# Patient Record
Sex: Male | Born: 1940 | Race: White | Hispanic: No | Marital: Single | Smoking: Never smoker
Health system: Southern US, Community
[De-identification: ages and names within clinical notes are randomized; demographics above are authoritative.]

## PROBLEM LIST (undated history)

## (undated) DIAGNOSIS — G039 Meningitis, unspecified: Secondary | ICD-10-CM

## (undated) DIAGNOSIS — E119 Type 2 diabetes mellitus without complications: Secondary | ICD-10-CM

## (undated) DIAGNOSIS — H3530 Unspecified macular degeneration: Secondary | ICD-10-CM

## (undated) DIAGNOSIS — E611 Iron deficiency: Secondary | ICD-10-CM

## (undated) DIAGNOSIS — I10 Essential (primary) hypertension: Secondary | ICD-10-CM

## (undated) DIAGNOSIS — D649 Anemia, unspecified: Secondary | ICD-10-CM

## (undated) DIAGNOSIS — E785 Hyperlipidemia, unspecified: Secondary | ICD-10-CM

## (undated) HISTORY — PX: APPENDECTOMY: SHX54

## (undated) HISTORY — PX: TONSILLECTOMY: SUR1361

## (undated) HISTORY — PX: COLONOSCOPY: SHX174

## (undated) HISTORY — PX: HERNIA REPAIR: SHX51

## (undated) HISTORY — PX: ESOPHAGOGASTRODUODENOSCOPY: SHX1529

---

## 2006-03-17 ENCOUNTER — Encounter: Admission: RE | Admit: 2006-03-17 | Discharge: 2006-03-17 | Payer: Self-pay | Admitting: Internal Medicine

## 2006-03-17 IMAGING — CT CT CHEST W/ CM
4 series · 18 of 30 positions shown, 19 images · IV contrast (75CC OMNI 300)
Comparison: none

CLINICAL DATA: Enlarged heart.  Question descending thoracic aortic aneurysm on chest x-ray.
 CT CHEST WITH CONTRAST:
TECHNIQUE: Multidetector CT imaging of the chest was performed following the standard protocol during bolus administration of intravenous contrast.
 Contrast:  75 cc Omnipaque 300

[Series 2: routine chest · axial · 0.79mm/px · z∈[-216,-52]mm · 4 of 67 slices shown, 5 images]
[im 17/67  mediastinal]
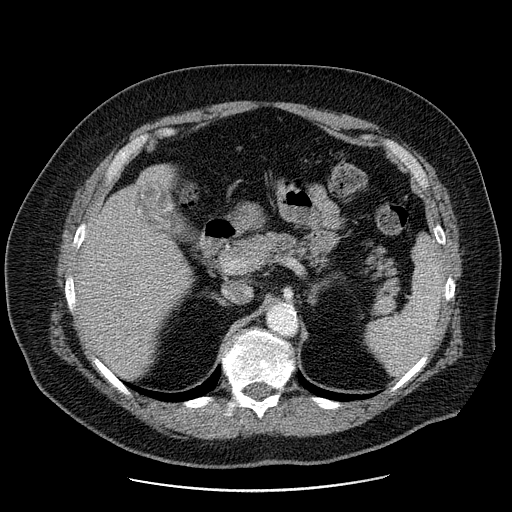
[im 17/67  lung]
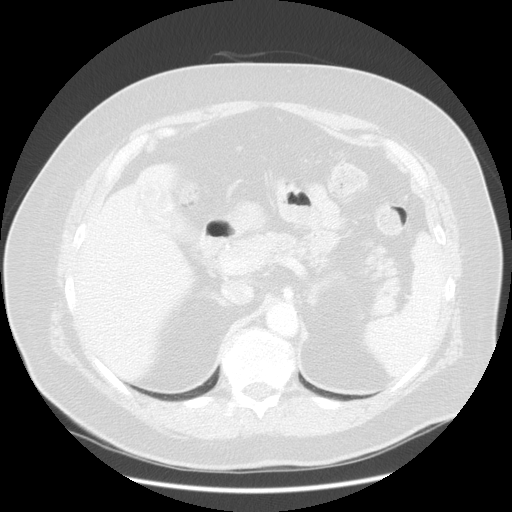
[im 34/67  lung]
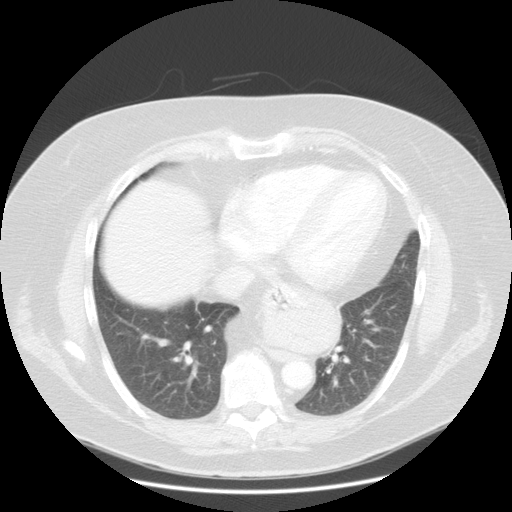
[im 36/67  lung]
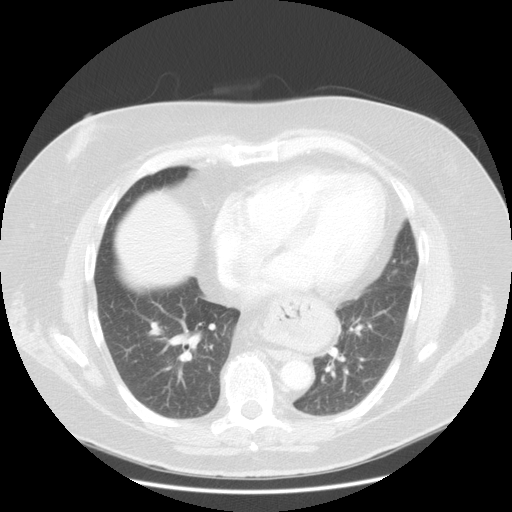
[im 50/67  lung]
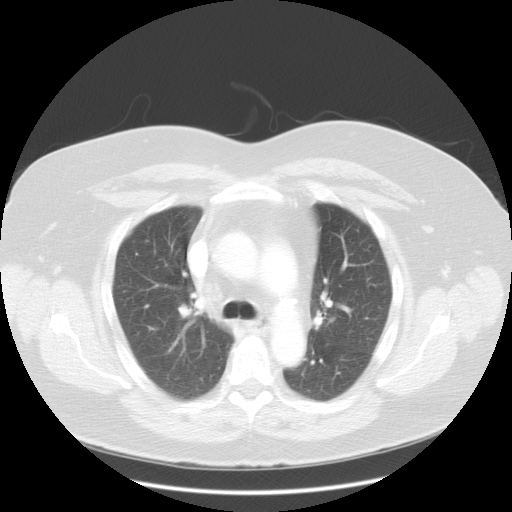

[Series 3: recon 2: routine chest · axial · 0.79mm/px · z∈[-186,-42]mm · 4 of 59 slices shown]
[im 15/59  lung]
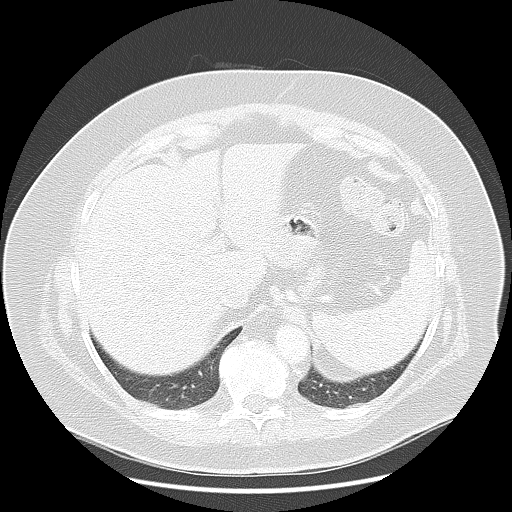
[im 28/59  lung]
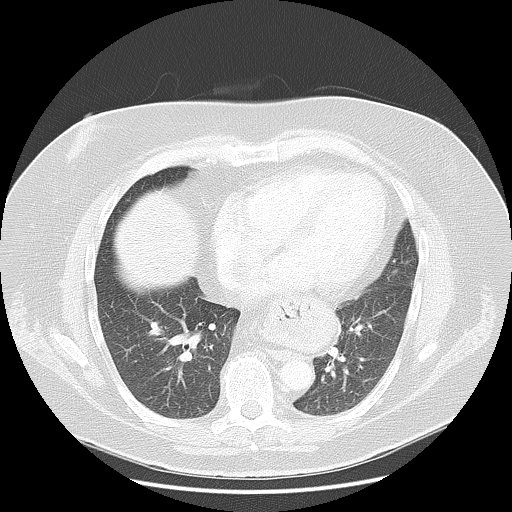
[im 30/59  lung]
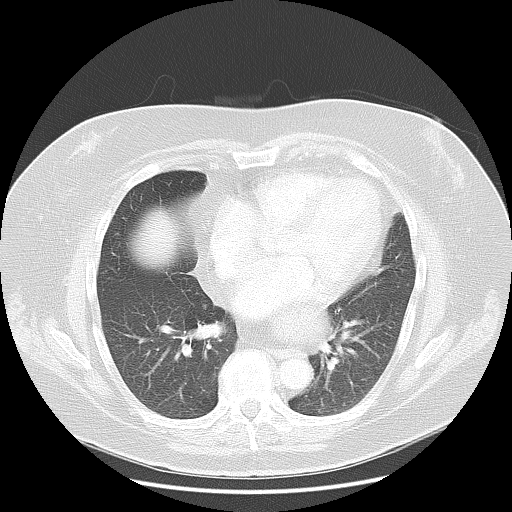
[im 44/59  lung]
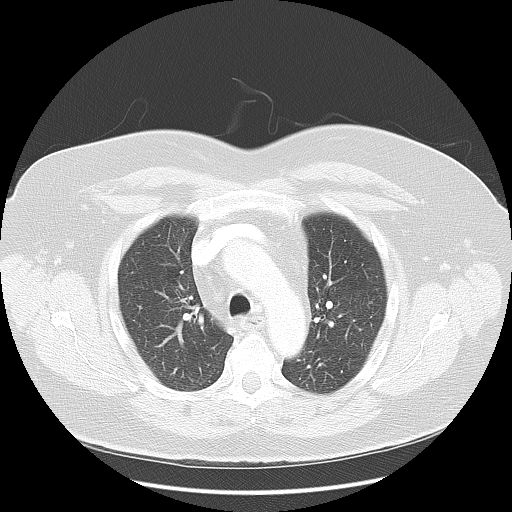

[Series 103: reformatted · sagittal · 0.79mm/px · 8 of 131 slices shown (1 of 2)]
[im 15/131  lung]
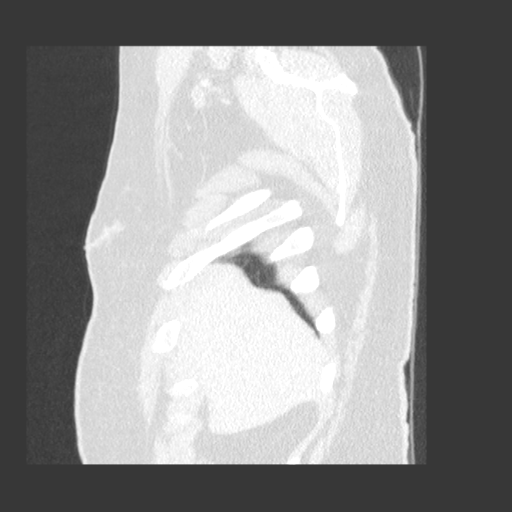
[im 29/131  lung]
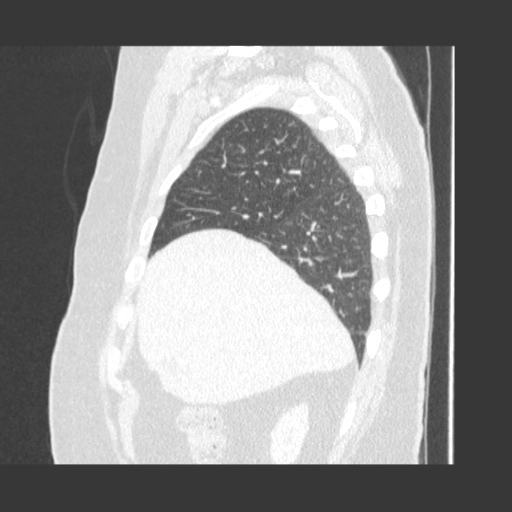
[im 44/131  lung]
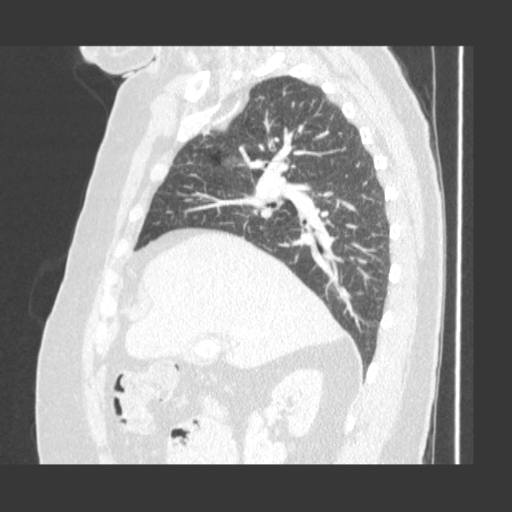
[im 58/131  lung]
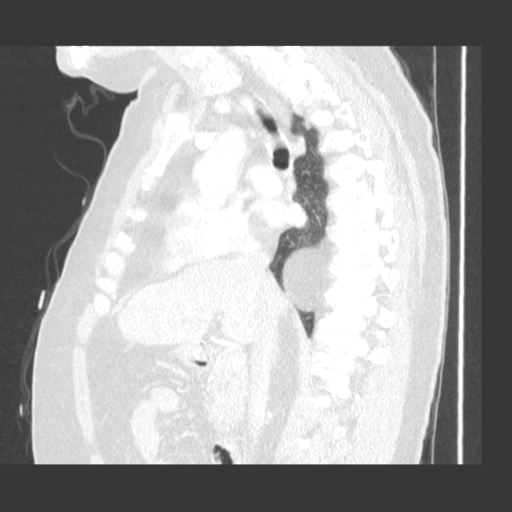
[im 73/131  lung]
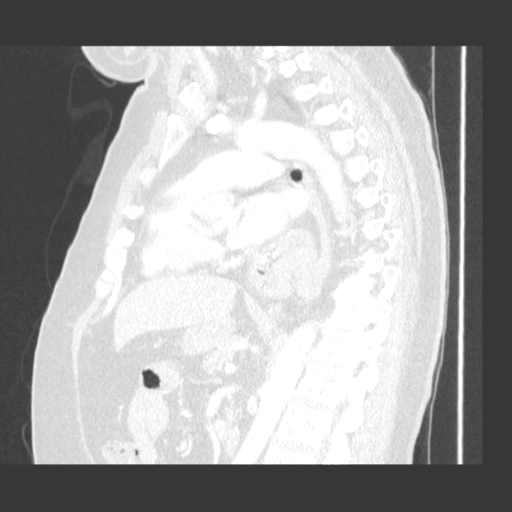
[im 87/131  lung]
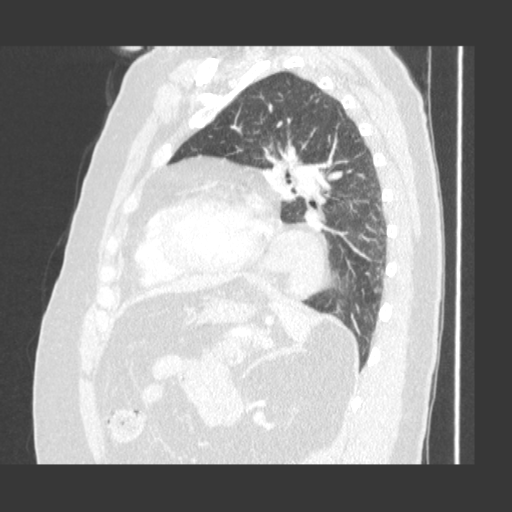
[im 102/131  lung]
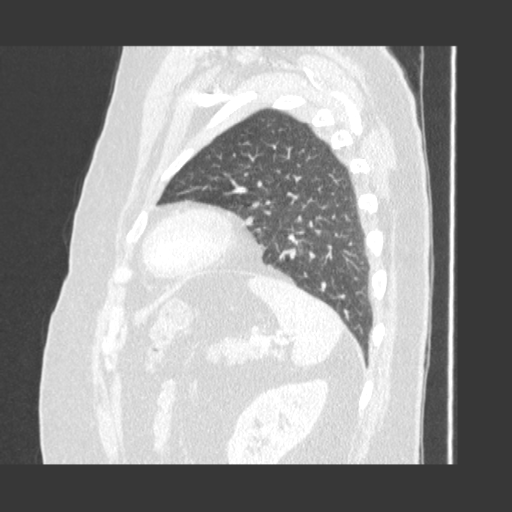
[im 116/131  lung]
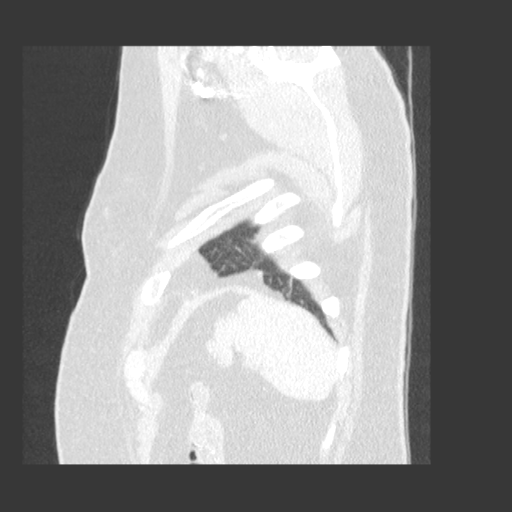

[Series 104: reformatted · coronal · 0.79mm/px · 2 of 106 slices shown (2 of 2)]
[im 16/106  lung]
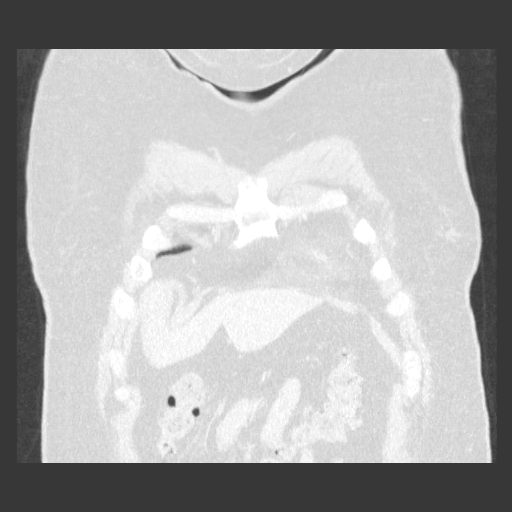
[im 31/106  lung]
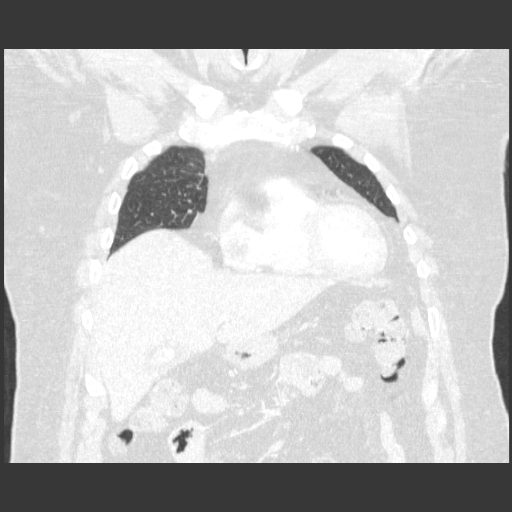

[18 of 30 positions shown; findings below may reference images not displayed]

FINDINGS: The thoracic aorta opacifies well and there is no evidence of thoracic aortic aneurysm.  The mass created on chest x-ray at the medial left lung base represents a moderate to large hiatal hernia.  The pulmonary arteries opacify normally.  No mediastinal or hilar adenopathy is seen with mild mediastinal lipomatosis present.  No bony abnormality is seen.   Within the upper abdomen, there are multiple gallstones partially calcified within the gallbladder.
IMPRESSION: 1.  The mass noted medially at the left lung base on chest x-ray represents a moderate to large hiatal hernia.  No thoracic aortic aneurysm is seen. 
 2.  Multiple gallstones.

## 2006-07-16 ENCOUNTER — Inpatient Hospital Stay (HOSPITAL_COMMUNITY): Admission: AD | Admit: 2006-07-16 | Discharge: 2006-07-17 | Payer: Self-pay | Admitting: Internal Medicine

## 2006-07-19 ENCOUNTER — Ambulatory Visit: Payer: Self-pay | Admitting: Gastroenterology

## 2006-08-26 ENCOUNTER — Ambulatory Visit (HOSPITAL_COMMUNITY): Admission: RE | Admit: 2006-08-26 | Discharge: 2006-08-26 | Payer: Self-pay | Admitting: *Deleted

## 2008-12-02 ENCOUNTER — Encounter (HOSPITAL_COMMUNITY): Admission: RE | Admit: 2008-12-02 | Discharge: 2009-03-02 | Payer: Self-pay | Admitting: Internal Medicine

## 2010-06-09 ENCOUNTER — Ambulatory Visit (HOSPITAL_COMMUNITY): Admission: RE | Admit: 2010-06-09 | Discharge: 2010-06-09 | Payer: Self-pay | Admitting: Internal Medicine

## 2010-09-09 ENCOUNTER — Ambulatory Visit (HOSPITAL_COMMUNITY): Admission: RE | Admit: 2010-09-09 | Discharge: 2010-09-09 | Payer: Self-pay | Admitting: Gastroenterology

## 2011-02-06 LAB — CROSSMATCH: ABO/RH(D): B POS

## 2011-03-08 LAB — CROSSMATCH: ABO/RH(D): B POS

## 2011-03-08 LAB — CBC
HCT: 31.8 % — ABNORMAL LOW (ref 39.0–52.0)
Hemoglobin: 9.7 g/dL — ABNORMAL LOW (ref 13.0–17.0)
MCHC: 30.5 g/dL (ref 30.0–36.0)
MCV: 69.5 fL — ABNORMAL LOW (ref 78.0–100.0)
RBC: 4.57 MIL/uL (ref 4.22–5.81)
RDW: 25.9 % — ABNORMAL HIGH (ref 11.5–15.5)

## 2011-04-09 NOTE — H&P (Signed)
NAMECAIDEN, ARTEAGA NO.:  [id number]   MEDICAL RECORD NO.:  [id number]          PATIENT TYPE:  OBV   LOCATION:  4707                         FACILITY:  [facility]   PHYSICIAN:  Lonia Blood, M.D.DATE OF BIRTH:  [birth year]   DATE OF ADMISSION:  07/15/2006  DATE OF DISCHARGE:                                HISTORY & PHYSICAL   PRIMARY CARE PHYSICIAN:  Mcdonald Reiling. Lendell Caprice, M.D.   CHIEF COMPLAINT:  Severe anemia with syncope.   HISTORY OF THE PRESENT ILLNESS:  Mr. Dustin Bumbaugh is a 70 year old  gentleman with a medical history as detailed below.  The history is somewhat  difficult as the patient is somewhat overcumudgeon and has to be redirected  o multiple occasions during the history.  It does appear, however, that the  patient suffered a syncopal spell approximately a week ago.  He describes  himself as having been out driving around in his cool car with the air  conditioner running, stepping out at home and into the heat, feeling a  sudden head rush, and then slumping to the ground.  He states that her did  not actually pass out, but he just felt extremely weak all of a sudden.  He did not lose consciousness by his report.  Her was able to get himself  back up to his feet though he felt very lightheaded.  He then stumbled to  the stairs where he sat for approximately 10 minutes before regaining his  strength enough to go int his condo.  He has not had similar symptoms since  then.  He denies dizziness on standing now, but does report that he does  feel somewhat weak in general.  He has had a regular appetite.  He has not  noticed any melena or hematochezia.  He has not had any change in his  weight.  He has not had chest pain or shortness of breath.  There has been  no hemoptysis or hematemesis.   After his episode he contacted his primary care physician's office and he  was able to be seen on July 11, 2006.  At that time blood tests were  obtained,  which ultimately revealed that the patient had a hemoglobin of  5.2.  The patient was contacted by his primary care physician and returned  to the office for recheck.  Recheck of the hemoglobin revealed a hemoglobin  of 5.7.  This was supported by findings in Dr. Pincus Sanes office of pallor.  As a result the patient was contacted by Dr. Lendell Caprice and advised that he  should present immediately to the hospital for direct admission.   The patient is seen up on 4700 after he had already been placed into a bed.   REVIEW OF SYSTEMS:  As noted above the patient states that he has been in  his usual state of health with the exception to the positive illness noted  above.  He has no other specific complaints at this time other than the fact  that he does not wish to be in the hospital.   PAST MEDICAL HISTORY:  1. Large hiatal hernia diagnosed via CT scan in April 2007.  2. Multiple gallstones diagnosed via CT scan in April 2007.  3. Hyperlipidemia.  4. Benign prostatic hypertrophy.  5. Inguinal hernia repair on the left in 1983.  6. Appendectomy in 1984.   MEDICATIONS:  Outpatient medications include:  1. Enteric coated aspirin 81 mg daily.  2. Lipitor 10 mg daily.   ALLERGIES:  No known drug allergies.   FAMILY HISTORY:  The family history is noncontributory to this admission.   SOCIAL HISTORY:  The patient lives in [place].  He lives alone.  He has  never been married.  He has no children.  He does not smoke.  He occasional  partakes of alcohol, specifically scotch.   DATA REVIEWED:  Hemoglobin today is 5.7 with a white count of 11.1 and a  platelet count of 505,000 and MCV 62.  Sodium, potassium, chloride, bicarb,  BUN, creatinine, and glucose are all normal.  Calcium is normal.  Hemoglobin  was 5.2 on July 11, 2006 in Dr. Pincus Sanes office.  Stool guaiac cards  were obtained through Dr. Pincus Sanes office and reportedly were all three  negative as an outpatient.  INR 1.1, PTT  27.   PHYSICAL EXAMINATION:  VITAL SIGNS:  Temperature 98.5, blood pressure  156/78, respiratory rate 20, O2 sat  99% on room air, and heart rate 102.  GENERAL APPEARANCE:  This is an obese male in no acute respiratory distress.  SKIN:  Cutaneous - the patient is markedly pale.  There is severe pallor of  the palms and there is loss of coloration in the nailbeds.  HEENT:  Normocephalic and atraumatic.  Pupils are equal, round and react to  light and accommodation.  Extraocular muscles are intact bilaterally.  Oral  cavity and oropharynx, clear.  NECK:  The neck is with no JVD and no lymphadenopathy.  LUNGS:  The lungs are clear to auscultation bilaterally without wheezes or  rhonchi.  HEART:  Cardiovascular - regular rate and rhythm without murmur, gallop or  rub.  Normal S1 and S2.  ABDOMEN:  The abdomen is obese, soft and bowel sounds are present.  No  hepatosplenomegaly and no rebound.  No ascites.  EXTREMITIES:  The extremities show no significant cyanosis, clubbing or  edema in the bilateral lower extremities.  NEUROLOGIC:  The patient is alert and oriented times four.  Cranial nerves  II-XII are intact bilaterally.  The patient has 5/5 strength bilateral upper  and lower extremities.  No Babinski's.  Intact sensation to touch  throughout.   IMPRESSION AND PLAN:  1. Severe microcytic anemia.  My initial concern with Mr. Liller is that of a possible gastrointestinal  source of blood loss.  It is confusing, however, that he would have three  stool cards, which were negative in the outpatient setting.   We will retest the patient in the hospital.  In that he has never had a  colonoscopy he would qualify based solely on the fact that he is presently  9.  Regardless of our findings during his anemia workup I will advise that  he proceed with plans to have his colonoscopy on Monday with Dr. Sabino Gasser as arranged by Dr. Lendell Caprice.  We must also consider the possibility of   hemolysis.  Laboratories were obtained prior to his transfusion and we will  await the results of those for further evaluation.  A third possibility is  that of a myeloproliferative disorder such as myelodysplasia.  Should the  patient's gastrointestinal evaluation be unremarkable and should hemolysis  workup be unrevealing we will need to consider hematology referral with bone  marrow biopsy.  Regardless, the patient is symptomatic from his severe  anemia and will require transfusion.  We are transfusing initially two units  of packed red blood cells with a target hemoglobin of 8.0 in the absence of  coronary disease.  We will check serial complete blood counts to assure the  patient's hemoglobin is stable and we will discontinue transfusions at such  time that he has a hemoglobin of greater than 8.   1. Syncope.   The patient is currently wearing a Holter monitor as set up in the  outpatient setting because of his syncope.  It appears clear to me that his  syncope is secondary to his anemia instead.  We will follow the patient on  telemetry during his hospital stay.  We will allow the Holter to remain in  place as well simply to complete the study and to assure that the device is  not lost during his hospital stay.   1. Hypertension.  The patient has mildly elevated blood pressure.  This is understandable in  this gentleman who is quite aggravated that he is in the hospital to begin  with.  I have explained to him the reasons for his hospitalization.   We will follow his blood pressure without treatment for now; and, if he  continues to trend high throughout his hospital stay we will consider adding  an agent.   1. Hyperlipidemia.   We will continue his Lipitor as previously dosed.      Lonia Blood, M.D.  Electronically Signed     JTM/MEDQ  D:  07/15/2006  T:  07/15/2006  Job:  [id number]   cc:   Janae Bridgeman. Eloise Harman., M.D.

## 2011-04-09 NOTE — Discharge Summary (Signed)
NAMETAIGE, HOUSMAN NO.:  [id number]   MEDICAL RECORD NO.:  [id number]          PATIENT TYPE:  INP   LOCATION:  4707                         FACILITY:  [facility]   PHYSICIAN:  Della Goo, M.D. DATE OF BIRTH:  02/06/1941   DATE OF ADMISSION:  07/15/2006  DATE OF DISCHARGE:  07/17/2006                                 DISCHARGE SUMMARY   CONSULTANTS:  [facility] on-call.   FINAL DIAGNOSES:  1. Severe microcytic anemia.  2. Hyperlipidemia.  3. Benign prostatic hypertrophy.  4. Obesity.   HOSPITAL COURSE:  This is a 70 year old gentleman who was sent to the  emergency department secondary to a severely decreased hemoglobin level,  found to be 5.7 on admission.  Patient was admitted for further evaluation  and for transfusion.  Patient denied having any history of hematemesis,  melena or hematochezia.  He stated that his primary care physician sent lab  work based on examination and findings of pallor.  Patient denied having any  symptoms of weakness or dizziness or syncope.   Patient was admitted and has transfusion of 2 units of packed red blood  cells with an appropriate rise in his hemoglobin leve from 5.7 to 7.4.  He  remained hemodynamically stable and while hospitalized had no hematochezia  or heme positive stools.  He was transfused 2 additional units of packed red  blood cells and had an appropriate rise to 9.6 on discharge.  Patient was  seen by [facility] and discussed options of undergoing a GI evaluation while  hospitalized, but patient declined and opted to have his workup performed as  an outpatient with Dr. Sabino Gasser.  An appointment was scheduled for patient  to followup with Dr. Virginia Rochester on Monday, July 18, 2006.  Patient was given  further instructions to continue to hold his aspirin therapy until being  seen by Dr. Virginia Rochester.  He will continue on his regular medications, which are  Lipitor 10 mg one p.o. q.day.   DISPOSITION:  The  patient was discharged to home and is to followup with Dr.  Virginia Rochester, as well as his primary care physician, Dr. Lendell Caprice.      Della Goo, M.D.  Electronically Signed     HJ/MEDQ  D:  07/17/2006  T:  07/17/2006  Job:  [id number]   cc:   Georgiana Spinner, M.D.  Corinna L. Lendell Caprice, MD

## 2014-12-19 ENCOUNTER — Encounter (HOSPITAL_COMMUNITY): Payer: Self-pay | Admitting: General Practice

## 2014-12-19 ENCOUNTER — Observation Stay (HOSPITAL_COMMUNITY)
Admission: AD | Admit: 2014-12-19 | Discharge: 2014-12-20 | Disposition: A | Discharge status: Home / Self Care | Payer: Medicare Other | Source: Ambulatory Visit | Attending: Internal Medicine | Admitting: Internal Medicine

## 2014-12-19 DIAGNOSIS — M5134 Other intervertebral disc degeneration, thoracic region: Secondary | ICD-10-CM | POA: Diagnosis not present

## 2014-12-19 DIAGNOSIS — I10 Essential (primary) hypertension: Secondary | ICD-10-CM | POA: Diagnosis not present

## 2014-12-19 DIAGNOSIS — D649 Anemia, unspecified: Secondary | ICD-10-CM | POA: Insufficient documentation

## 2014-12-19 DIAGNOSIS — R42 Dizziness and giddiness: Secondary | ICD-10-CM | POA: Insufficient documentation

## 2014-12-19 DIAGNOSIS — D509 Iron deficiency anemia, unspecified: Secondary | ICD-10-CM | POA: Diagnosis not present

## 2014-12-19 DIAGNOSIS — E119 Type 2 diabetes mellitus without complications: Secondary | ICD-10-CM | POA: Diagnosis not present

## 2014-12-19 DIAGNOSIS — E785 Hyperlipidemia, unspecified: Secondary | ICD-10-CM | POA: Diagnosis not present

## 2014-12-19 DIAGNOSIS — H3530 Unspecified macular degeneration: Secondary | ICD-10-CM | POA: Diagnosis not present

## 2014-12-19 DIAGNOSIS — E780 Pure hypercholesterolemia: Secondary | ICD-10-CM | POA: Diagnosis not present

## 2014-12-19 DIAGNOSIS — M5136 Other intervertebral disc degeneration, lumbar region: Secondary | ICD-10-CM | POA: Diagnosis not present

## 2014-12-19 DIAGNOSIS — M549 Dorsalgia, unspecified: Secondary | ICD-10-CM | POA: Diagnosis not present

## 2014-12-19 HISTORY — DX: Meningitis, unspecified: G03.9

## 2014-12-19 HISTORY — DX: Anemia, unspecified: D64.9

## 2014-12-19 HISTORY — DX: Unspecified macular degeneration: H35.30

## 2014-12-19 HISTORY — DX: Iron deficiency: E61.1

## 2014-12-19 HISTORY — DX: Essential (primary) hypertension: I10

## 2014-12-19 HISTORY — DX: Hyperlipidemia, unspecified: E78.5

## 2014-12-19 LAB — PREPARE RBC (CROSSMATCH)

## 2014-12-19 LAB — COMPREHENSIVE METABOLIC PANEL
ALK PHOS: 66 U/L (ref 39–117)
ALT: 23 U/L (ref 0–53)
AST: 20 U/L (ref 0–37)
Albumin: 3.6 g/dL (ref 3.5–5.2)
Anion gap: 8 (ref 5–15)
BUN: 23 mg/dL (ref 6–23)
CALCIUM: 8.7 mg/dL (ref 8.4–10.5)
CO2: 22 mmol/L (ref 19–32)
Chloride: 109 mmol/L (ref 96–112)
Creatinine, Ser: 1.06 mg/dL (ref 0.50–1.35)
GFR calc Af Amer: 78 mL/min — ABNORMAL LOW (ref >90)
GFR calc non Af Amer: 68 mL/min — ABNORMAL LOW (ref >90)
GLUCOSE: 117 mg/dL — AB (ref 70–99)
POTASSIUM: 4.5 mmol/L (ref 3.5–5.1)
Sodium: 139 mmol/L (ref 135–145)
Total Bilirubin: 0.7 mg/dL (ref 0.3–1.2)
Total Protein: 6.6 g/dL (ref 6.0–8.3)

## 2014-12-19 LAB — TSH: TSH: 1.924 u[IU]/mL (ref 0.350–4.500)

## 2014-12-19 MED ORDER — ACETAMINOPHEN 325 MG PO TABS: 650.0000 mg | ORAL_TABLET | Freq: Four times a day (QID) | ORAL | Status: DC | PRN

## 2014-12-19 MED ORDER — DIPHENHYDRAMINE HCL 25 MG PO CAPS
25.0000 mg | ORAL_CAPSULE | Freq: Once | ORAL | Status: AC
  Administered 2014-12-19: 25 mg via ORAL
  Filled 2014-12-19: qty 1

## 2014-12-19 MED ORDER — ACETAMINOPHEN 325 MG PO TABS
650.0000 mg | ORAL_TABLET | Freq: Once | ORAL | Status: AC
  Administered 2014-12-19: 650 mg via ORAL
  Filled 2014-12-19: qty 2

## 2014-12-19 MED ORDER — ACETAMINOPHEN 650 MG RE SUPP: 650.0000 mg | Freq: Four times a day (QID) | RECTAL | Status: DC | PRN

## 2014-12-19 MED ORDER — FLUTICASONE PROPIONATE 50 MCG/ACT NA SUSP: 1.0000 | Freq: Two times a day (BID) | NASAL | Status: DC | PRN

## 2014-12-19 MED ORDER — SODIUM CHLORIDE 0.9 % IV SOLN: Freq: Once | INTRAVENOUS | Status: DC

## 2014-12-19 NOTE — H&P (Signed)
James Cooke is an 74 y.o. male.    Pcp:  [name]  Chief Complaint: anemia HPI: 74 yo male with htn, hyperlipidemia, iron def anemia c/o dizziness since 10/2014, pt found on labs to have hgb 5.2.  Pt sent to the hospital for transfusion.  Denies abd pain, n/v, diarrhea, brbpr.    Past Medical History  Diagnosis Date  . Hypertension   . Hyperlipidemia   . Anemia   . Meningitis   . Iron deficiency   . Macular degeneration     Past Surgical History  Procedure Laterality Date  . Tonsillectomy    . Appendectomy    . Hernia repair    . Esophagogastroduodenoscopy    . Colonoscopy      Family History  Problem Relation Age of Onset  . CAD Mother    Social History:  reports that he has never smoked. He does not have any smokeless tobacco history on file. He reports that he does not drink alcohol. His drug history is not on file.  Allergies: No Known Allergies  Medications Prior to Admission  Medication Sig Dispense Refill  . amLODipine (NORVASC) 5 MG tablet Take 5 mg by mouth daily.    . Ascorbic Acid (VITAMIN C) 1000 MG tablet Take 1,000 mg by mouth daily.    Marland Kitchen atorvastatin (LIPITOR) 80 MG tablet Take 80 mg by mouth daily.    . Cyanocobalamin (VITAMIN B 12 PO) Take 1 tablet by mouth daily.    . ferrous sulfate 325 (65 FE) MG tablet Take 325 mg by mouth at bedtime.    . Fish Oil-Cholecalciferol (OMEGA-3 + VITAMIN D3 PO) Take 1 tablet by mouth daily.    . fluticasone (FLONASE) 50 MCG/ACT nasal spray Place 1-2 sprays into both nostrils 2 (two) times daily as needed for allergies or rhinitis.    . metFORMIN (GLUCOPHAGE) 500 MG tablet Take 500 mg by mouth 2 (two) times daily with a meal.    . Multiple Vitamins-Minerals (ICAPS) CAPS Take 1 capsule by mouth daily.    . naproxen sodium (ANAPROX) 220 MG tablet Take 440 mg by mouth daily as needed (pain).      Results for orders placed or performed during the hospital encounter of 12/19/14 (from the past 48 hour(s))  Prepare RBC      Status: None   Collection Time: 12/19/14  6:00 PM  Result Value Ref Range   Order Confirmation ORDER PROCESSED BY BLOOD BANK   Type and screen     Status: None (Preliminary result)   Collection Time: 12/19/14  6:30 PM  Result Value Ref Range   ABO/RH(D) B POS    Antibody Screen NEG    Sample Expiration 12/22/2014    Unit Number [id number]    Blood Component Type RED CELLS,LR    Unit division 00    Status of Unit ISSUED    Transfusion Status OK TO TRANSFUSE    Crossmatch Result Compatible    Unit Number [id number]    Blood Component Type RED CELLS,LR    Unit division 00    Status of Unit ISSUED,FINAL    Transfusion Status OK TO TRANSFUSE    Crossmatch Result Compatible    Unit Number [id number]    Blood Component Type RED CELLS,LR    Unit division 00    Status of Unit ISSUED    Transfusion Status OK TO TRANSFUSE    Crossmatch Result Compatible   Vitamin B12     Status: None  Collection Time: 12/19/14  6:30 PM  Result Value Ref Range   Vitamin B-12 869 211 - 911 pg/mL    Comment: Performed at [facility]  Iron and TIBC     Status: Abnormal   Collection Time: 12/19/14  6:30 PM  Result Value Ref Range   Iron <10 (L) 42 - 165 ug/dL    Comment: Result repeated and verified.   TIBC Not calculated due to Iron <10. 215 - 435 ug/dL   Saturation Ratios Not calculated due to Iron <10. 20 - 55 %   UIBC 452 (H) 125 - 400 ug/dL    Comment: Performed at [facility]  Ferritin     Status: Abnormal   Collection Time: 12/19/14  6:30 PM  Result Value Ref Range   Ferritin 7 (L) 22 - 322 ng/mL    Comment: Performed at [facility]  TSH     Status: None   Collection Time: 12/19/14  6:30 PM  Result Value Ref Range   TSH 1.924 0.350 - 4.500 uIU/mL    Comment: Performed at [facility]  Comprehensive metabolic panel     Status: Abnormal   Collection Time: 12/19/14  6:30 PM  Result Value Ref Range   Sodium 139 135 - 145 mmol/L   Potassium 4.5 3.5  - 5.1 mmol/L   Chloride 109 96 - 112 mmol/L   CO2 22 19 - 32 mmol/L   Glucose, Bld 117 (H) 70 - 99 mg/dL   BUN 23 6 - 23 mg/dL   Creatinine, Ser 1.06 0.50 - 1.35 mg/dL   Calcium 8.7 8.4 - 10.5 mg/dL   Total Protein 6.6 6.0 - 8.3 g/dL   Albumin 3.6 3.5 - 5.2 g/dL   AST 20 0 - 37 U/L   ALT 23 0 - 53 U/L   Alkaline Phosphatase 66 39 - 117 U/L   Total Bilirubin 0.7 0.3 - 1.2 mg/dL   GFR calc non Af Amer 68 (L) >90 mL/min   GFR calc Af Amer 78 (L) >90 mL/min    Comment: (NOTE) The eGFR has been calculated using the CKD EPI equation. This calculation has not been validated in all clinical situations. eGFR's persistently <90 mL/min signify possible Chronic Kidney Disease.    Anion gap 8 5 - 15  ABO/Rh     Status: None   Collection Time: 12/19/14  6:30 PM  Result Value Ref Range   ABO/RH(D) B POS   Hemoglobin and hematocrit, blood     Status: Abnormal   Collection Time: 12/20/14  9:30 AM  Result Value Ref Range   Hemoglobin 8.3 (L) 13.0 - 17.0 g/dL   HCT 28.2 (L) 39.0 - 52.0 %   No results found.  Review of Systems  Constitutional: Negative for fever, chills, weight loss, malaise/fatigue and diaphoresis.  HENT: Negative for congestion, ear discharge, ear pain, hearing loss, nosebleeds, sore throat and tinnitus.   Eyes: Negative for blurred vision, double vision, photophobia, pain, discharge and redness.  Respiratory: Negative for cough, hemoptysis, sputum production, shortness of breath, wheezing and stridor.   Cardiovascular: Negative for chest pain, palpitations, orthopnea, claudication, leg swelling and PND.  Skin: Negative for itching and rash.  Neurological: Negative for weakness and headaches.    Blood pressure 155/66, pulse 71, temperature 97.6 F (36.4 C), temperature source Oral, resp. rate 20, SpO2 98 %. Physical Exam  Constitutional: He is oriented to person, place, and time. He appears well-developed and well-nourished.  HENT:  Head: Normocephalic and atraumatic.   Eyes: EOM are normal. Pupils are equal, round, and reactive to light.  Pale conjuctiva  Neck: Normal range of motion. Neck supple. No JVD present. No tracheal deviation present. No thyromegaly present.  Cardiovascular: Normal rate and regular rhythm.  Exam reveals no gallop and no friction rub.   No murmur heard. Respiratory: Effort normal and breath sounds normal. No respiratory distress. He has no wheezes. He has no rales.  GI: Soft. Bowel sounds are normal. He exhibits no distension. There is no tenderness. There is no rebound and no guarding.  Musculoskeletal: Normal range of motion. He exhibits no edema or tenderness.  Lymphadenopathy:    He has no cervical adenopathy.  Neurological: He is alert and oriented to person, place, and time. He has normal reflexes. He displays normal reflexes. No cranial nerve deficit. He exhibits normal muscle tone. Coordination normal.  Skin: Skin is warm and dry. No rash noted. No erythema. No pallor.  Psychiatric: He has a normal mood and affect. His behavior is normal. Judgment and thought content normal.     Assessment/Plan Dizziness likely secondary to anemia  Anemia Type and screen Check iron , tibc, ferritin, b12, folate, tsh Transfuse 3 units prbc.  H/h 1 hour after transfusion  Hypertension Cont current tx.   [name] 12/20/2014, 10:07 AM

## 2014-12-20 ENCOUNTER — Encounter (HOSPITAL_COMMUNITY): Payer: Self-pay | Admitting: Internal Medicine

## 2014-12-20 DIAGNOSIS — H3530 Unspecified macular degeneration: Secondary | ICD-10-CM | POA: Diagnosis not present

## 2014-12-20 DIAGNOSIS — R42 Dizziness and giddiness: Secondary | ICD-10-CM | POA: Diagnosis not present

## 2014-12-20 DIAGNOSIS — E119 Type 2 diabetes mellitus without complications: Secondary | ICD-10-CM | POA: Diagnosis not present

## 2014-12-20 DIAGNOSIS — E785 Hyperlipidemia, unspecified: Secondary | ICD-10-CM | POA: Diagnosis not present

## 2014-12-20 DIAGNOSIS — D649 Anemia, unspecified: Secondary | ICD-10-CM | POA: Diagnosis not present

## 2014-12-20 DIAGNOSIS — I10 Essential (primary) hypertension: Secondary | ICD-10-CM | POA: Diagnosis not present

## 2014-12-20 DIAGNOSIS — D509 Iron deficiency anemia, unspecified: Secondary | ICD-10-CM | POA: Diagnosis not present

## 2014-12-20 LAB — VITAMIN B12: VITAMIN B 12: 869 pg/mL (ref 211–911)

## 2014-12-20 LAB — ABO/RH: ABO/RH(D): B POS

## 2014-12-20 LAB — IRON AND TIBC: UIBC: 452 ug/dL — ABNORMAL HIGH (ref 125–400)

## 2014-12-20 LAB — FERRITIN: Ferritin: 7 ng/mL — ABNORMAL LOW (ref 22–322)

## 2014-12-20 LAB — HEMOGLOBIN AND HEMATOCRIT, BLOOD
HCT: 28.2 % — ABNORMAL LOW (ref 39.0–52.0)
HEMOGLOBIN: 8.3 g/dL — AB (ref 13.0–17.0)

## 2014-12-20 MED ORDER — METFORMIN HCL 500 MG PO TABS
500.0000 mg | ORAL_TABLET | Freq: Two times a day (BID) | ORAL | Status: DC
  Filled 2014-12-20 (×2): qty 1

## 2014-12-20 MED ORDER — AMLODIPINE BESYLATE 5 MG PO TABS
5.0000 mg | ORAL_TABLET | Freq: Every day | ORAL | Status: DC
  Filled 2014-12-20: qty 1

## 2014-12-20 MED ORDER — ATORVASTATIN CALCIUM 80 MG PO TABS
80.0000 mg | ORAL_TABLET | Freq: Every day | ORAL | Status: DC
  Filled 2014-12-20: qty 1

## 2014-12-20 NOTE — Discharge Instructions (Signed)
Please contact pcp if feels weak or lightheaded, or sob.

## 2014-12-20 NOTE — Progress Notes (Signed)
Benedetto Coons to be D/C'd Home per MD order.  Discussed prescriptions and follow up appointments with the patient. Prescriptions given to patient, medication list explained in detail. Pt verbalized understanding.    Medication List    STOP taking these medications        naproxen sodium 220 MG tablet  Commonly known as:  ANAPROX      TAKE these medications        amLODipine 5 MG tablet  Commonly known as:  NORVASC  Take 5 mg by mouth daily.     atorvastatin 80 MG tablet  Commonly known as:  LIPITOR  Take 80 mg by mouth daily.     ferrous sulfate 325 (65 FE) MG tablet  Take 325 mg by mouth at bedtime.     fluticasone 50 MCG/ACT nasal spray  Commonly known as:  FLONASE  Place 1-2 sprays into both nostrils 2 (two) times daily as needed for allergies or rhinitis.     ICAPS Caps  Take 1 capsule by mouth daily.     metFORMIN 500 MG tablet  Commonly known as:  GLUCOPHAGE  Take 500 mg by mouth 2 (two) times daily with a meal.     OMEGA-3 + VITAMIN D3 PO  Take 1 tablet by mouth daily.     VITAMIN B 12 PO  Take 1 tablet by mouth daily.     vitamin C 1000 MG tablet  Take 1,000 mg by mouth daily.        Filed Vitals:   12/20/14 0700  BP: 155/66  Pulse: 71  Temp: 97.6 F (36.4 C)  Resp: 20    Skin clean, dry and intact without evidence of skin break down, no evidence of skin tears noted. IV catheter discontinued intact. Site without signs and symptoms of complications. Dressing and pressure applied. Pt denies pain at this time. No complaints noted.  An After Visit Summary was printed and given to the patient. Patient escorted via [place], and D/C home via private auto.  Nonie Hoyer S 12/20/2014 1:19 PM

## 2014-12-20 NOTE — Progress Notes (Signed)
UR completed 

## 2014-12-20 NOTE — Discharge Summary (Signed)
Physician Discharge Summary  Patient ID: James Cooke MRN: [id number] DOB/AGE: [birth year] 74 y.o.  Admit date: 12/19/2014 Discharge date: 12/20/2014  Admission Diagnoses: Anemia Dizziness  Discharge Diagnoses:  Active Problems:   Anemia   Discharged Condition: stable  Hospital Course:  74 yo male with hx of iron def anemia c/o lightheadedness since 10/2014.  Pt denies brbpr, hematuria.  Pt was admitted for symptomatic anemia.  Pt's hgb reponded well to transfusion of 3 units prbc.  Pt is doing better.  No dizziness.  Pt appears stable and will be discharged to home  Consults: None  Significant Diagnostic Studies: labs,  Hgb 5.2 in office  =>8.3  Treatments: transfusion of 3 units prbc  Discharge Exam: Blood pressure 155/66, pulse 71, temperature 97.6 F (36.4 C), temperature source Oral, resp. rate 20, SpO2 98 %. Heent: anicteric, pink conjunctiva Neck: no jvd Heart: rrr s1, s2,  Lung: ctab Abd: soft, nt, nd, +bs Ext: no c/c/e  Disposition:   Stable  A/P:   Symptomatic anemia Improved after transfusion of 3 units prbc.  Pt will continue to take his iron, and will need to follow up in 7-10 days for repeat cbc  Hypertension Cont amlodipine  Dm2:   Cont metformin      Medication List    STOP taking these medications        naproxen sodium 220 MG tablet  Commonly known as:  ANAPROX      TAKE these medications        amLODipine 5 MG tablet  Commonly known as:  NORVASC  Take 5 mg by mouth daily.     atorvastatin 80 MG tablet  Commonly known as:  LIPITOR  Take 80 mg by mouth daily.     ferrous sulfate 325 (65 FE) MG tablet  Take 325 mg by mouth at bedtime.     fluticasone 50 MCG/ACT nasal spray  Commonly known as:  FLONASE  Place 1-2 sprays into both nostrils 2 (two) times daily as needed for allergies or rhinitis.     ICAPS Caps  Take 1 capsule by mouth daily.     metFORMIN 500 MG tablet  Commonly known as:  GLUCOPHAGE  Take 500 mg by mouth  2 (two) times daily with a meal.     OMEGA-3 + VITAMIN D3 PO  Take 1 tablet by mouth daily.     VITAMIN B 12 PO  Take 1 tablet by mouth daily.     vitamin C 1000 MG tablet  Take 1,000 mg by mouth daily.           Follow-up Information    Follow up with [name], MD.   Specialty:  Internal Medicine   Contact information:   [address] [place] [address] [phone]       Signed: [name] 12/20/2014, 12:53 PM

## 2014-12-21 LAB — TYPE AND SCREEN
ABO/RH(D): B POS
Antibody Screen: NEGATIVE
UNIT DIVISION: 0
UNIT DIVISION: 0
Unit division: 0

## 2014-12-23 ENCOUNTER — Other Ambulatory Visit: Payer: Self-pay | Admitting: Internal Medicine

## 2014-12-23 DIAGNOSIS — R42 Dizziness and giddiness: Secondary | ICD-10-CM

## 2014-12-23 LAB — FOLATE RBC
Folate, Hemolysate: >620 ng/mL
Folate, RBC: >3316 ng/mL (ref >498)
HEMATOCRIT: 18.7 % — AB (ref 37.5–51.0)

## 2015-01-01 DIAGNOSIS — E780 Pure hypercholesterolemia: Secondary | ICD-10-CM | POA: Diagnosis not present

## 2015-01-01 DIAGNOSIS — E118 Type 2 diabetes mellitus with unspecified complications: Secondary | ICD-10-CM | POA: Diagnosis not present

## 2015-01-01 DIAGNOSIS — I10 Essential (primary) hypertension: Secondary | ICD-10-CM | POA: Diagnosis not present

## 2015-01-01 DIAGNOSIS — D649 Anemia, unspecified: Secondary | ICD-10-CM | POA: Diagnosis not present

## 2015-02-17 ENCOUNTER — Ambulatory Visit (HOSPITAL_COMMUNITY)
Admission: RE | Admit: 2015-02-17 | Discharge: 2015-02-17 | Disposition: A | Discharge status: Home / Self Care | Payer: Medicare Other | Source: Ambulatory Visit | Attending: Internal Medicine | Admitting: Internal Medicine

## 2015-02-17 ENCOUNTER — Other Ambulatory Visit: Payer: Self-pay | Admitting: Internal Medicine

## 2015-02-17 DIAGNOSIS — E118 Type 2 diabetes mellitus with unspecified complications: Secondary | ICD-10-CM | POA: Diagnosis not present

## 2015-02-17 DIAGNOSIS — I10 Essential (primary) hypertension: Secondary | ICD-10-CM | POA: Diagnosis not present

## 2015-02-17 DIAGNOSIS — D649 Anemia, unspecified: Secondary | ICD-10-CM | POA: Diagnosis not present

## 2015-02-17 DIAGNOSIS — E119 Type 2 diabetes mellitus without complications: Secondary | ICD-10-CM | POA: Diagnosis not present

## 2015-02-17 LAB — PREPARE RBC (CROSSMATCH)

## 2015-02-18 ENCOUNTER — Encounter (HOSPITAL_COMMUNITY)
Admission: RE | Admit: 2015-02-18 | Discharge: 2015-02-18 | Disposition: A | Discharge status: Home / Self Care | Payer: Medicare Other | Source: Ambulatory Visit | Attending: Internal Medicine | Admitting: Internal Medicine

## 2015-02-18 DIAGNOSIS — D649 Anemia, unspecified: Secondary | ICD-10-CM | POA: Diagnosis not present

## 2015-02-18 MED ORDER — ACETAMINOPHEN 325 MG PO TABS
650.0000 mg | ORAL_TABLET | Freq: Once | ORAL | Status: AC
  Administered 2015-02-18: 650 mg via ORAL

## 2015-02-18 MED ORDER — ACETAMINOPHEN 325 MG PO TABS
ORAL_TABLET | ORAL | Status: AC
  Filled 2015-02-18: qty 2

## 2015-02-18 MED ORDER — DIPHENHYDRAMINE HCL 25 MG PO CAPS
25.0000 mg | ORAL_CAPSULE | Freq: Once | ORAL | Status: AC
  Administered 2015-02-18: 25 mg via ORAL

## 2015-02-18 MED ORDER — SODIUM CHLORIDE 0.9 % IV SOLN: Freq: Once | INTRAVENOUS | Status: DC

## 2015-02-18 MED ORDER — DIPHENHYDRAMINE HCL 25 MG PO CAPS
ORAL_CAPSULE | ORAL | Status: AC
  Filled 2015-02-18: qty 1

## 2015-02-19 LAB — TYPE AND SCREEN
ABO/RH(D): B POS
ANTIBODY SCREEN: NEGATIVE
UNIT DIVISION: 0
Unit division: 0

## 2015-02-20 DIAGNOSIS — D649 Anemia, unspecified: Secondary | ICD-10-CM | POA: Diagnosis not present

## 2015-02-20 DIAGNOSIS — I10 Essential (primary) hypertension: Secondary | ICD-10-CM | POA: Diagnosis not present

## 2015-02-20 DIAGNOSIS — E780 Pure hypercholesterolemia: Secondary | ICD-10-CM | POA: Diagnosis not present

## 2015-02-20 DIAGNOSIS — E119 Type 2 diabetes mellitus without complications: Secondary | ICD-10-CM | POA: Diagnosis not present

## 2015-03-20 DIAGNOSIS — D509 Iron deficiency anemia, unspecified: Secondary | ICD-10-CM | POA: Diagnosis not present

## 2015-03-31 DIAGNOSIS — D649 Anemia, unspecified: Secondary | ICD-10-CM | POA: Diagnosis not present

## 2015-04-04 ENCOUNTER — Other Ambulatory Visit (HOSPITAL_COMMUNITY): Payer: Self-pay | Admitting: *Deleted

## 2015-04-04 ENCOUNTER — Other Ambulatory Visit: Payer: Self-pay | Admitting: Internal Medicine

## 2015-04-04 DIAGNOSIS — D649 Anemia, unspecified: Secondary | ICD-10-CM | POA: Diagnosis not present

## 2015-04-04 MED ORDER — ACETAMINOPHEN 325 MG PO TABS: 650.0000 mg | ORAL_TABLET | Freq: Once | ORAL | Status: AC

## 2015-04-04 MED ORDER — DIPHENHYDRAMINE HCL 25 MG PO CAPS: 25.0000 mg | ORAL_CAPSULE | Freq: Once | ORAL | Status: AC

## 2015-04-04 MED ORDER — SODIUM CHLORIDE 0.9 % IV SOLN: Freq: Once | INTRAVENOUS | Status: AC

## 2015-04-09 ENCOUNTER — Encounter (HOSPITAL_COMMUNITY)
Admission: RE | Admit: 2015-04-09 | Discharge: 2015-04-09 | Disposition: A | Discharge status: Home / Self Care | Payer: Medicare Other | Source: Ambulatory Visit | Attending: Internal Medicine | Admitting: Internal Medicine

## 2015-04-09 ENCOUNTER — Inpatient Hospital Stay (HOSPITAL_COMMUNITY): Admission: RE | Admit: 2015-04-09 | Payer: Medicare Other | Source: Ambulatory Visit

## 2015-04-09 ENCOUNTER — Inpatient Hospital Stay (HOSPITAL_COMMUNITY): Admission: RE | Admit: 2015-04-09 | Payer: Self-pay | Source: Ambulatory Visit

## 2015-04-09 DIAGNOSIS — D649 Anemia, unspecified: Secondary | ICD-10-CM | POA: Insufficient documentation

## 2015-04-09 LAB — PREPARE RBC (CROSSMATCH)

## 2015-04-10 ENCOUNTER — Encounter (HOSPITAL_COMMUNITY)
Admission: RE | Admit: 2015-04-10 | Discharge: 2015-04-10 | Disposition: A | Discharge status: Home / Self Care | Payer: Medicare Other | Source: Ambulatory Visit | Attending: Internal Medicine | Admitting: Internal Medicine

## 2015-04-10 DIAGNOSIS — D649 Anemia, unspecified: Secondary | ICD-10-CM | POA: Diagnosis not present

## 2015-04-10 MED ORDER — SODIUM CHLORIDE 0.9 % IV SOLN
Freq: Once | INTRAVENOUS | Status: AC
  Administered 2015-04-10: 08:00:00 via INTRAVENOUS

## 2015-04-10 MED ORDER — DIPHENHYDRAMINE HCL 25 MG PO CAPS
ORAL_CAPSULE | ORAL | Status: AC
  Administered 2015-04-10: 25 mg
  Filled 2015-04-10: qty 1

## 2015-04-10 MED ORDER — ACETAMINOPHEN 325 MG PO TABS
ORAL_TABLET | ORAL | Status: AC
  Administered 2015-04-10: 650 mg
  Filled 2015-04-10: qty 2

## 2015-04-10 MED ORDER — DIPHENHYDRAMINE HCL 25 MG PO CAPS: 25.0000 mg | ORAL_CAPSULE | Freq: Once | ORAL | Status: DC

## 2015-04-10 MED ORDER — ACETAMINOPHEN 325 MG PO TABS: 650.0000 mg | ORAL_TABLET | Freq: Once | ORAL | Status: DC

## 2015-04-11 LAB — TYPE AND SCREEN
ABO/RH(D): B POS
Antibody Screen: NEGATIVE
Unit division: 0
Unit division: 0

## 2015-04-14 DIAGNOSIS — D649 Anemia, unspecified: Secondary | ICD-10-CM | POA: Diagnosis not present

## 2015-04-18 ENCOUNTER — Other Ambulatory Visit: Payer: Self-pay | Admitting: Gastroenterology

## 2015-04-18 DIAGNOSIS — D126 Benign neoplasm of colon, unspecified: Secondary | ICD-10-CM | POA: Diagnosis not present

## 2015-04-18 DIAGNOSIS — D125 Benign neoplasm of sigmoid colon: Secondary | ICD-10-CM | POA: Diagnosis not present

## 2015-04-18 DIAGNOSIS — Z86010 Personal history of colonic polyps: Secondary | ICD-10-CM | POA: Diagnosis not present

## 2015-04-18 DIAGNOSIS — Z1211 Encounter for screening for malignant neoplasm of colon: Secondary | ICD-10-CM | POA: Diagnosis not present

## 2015-04-18 DIAGNOSIS — D509 Iron deficiency anemia, unspecified: Secondary | ICD-10-CM | POA: Diagnosis not present

## 2015-04-18 DIAGNOSIS — Z09 Encounter for follow-up examination after completed treatment for conditions other than malignant neoplasm: Secondary | ICD-10-CM | POA: Diagnosis not present

## 2015-04-18 DIAGNOSIS — K5730 Diverticulosis of large intestine without perforation or abscess without bleeding: Secondary | ICD-10-CM | POA: Diagnosis not present

## 2015-04-18 DIAGNOSIS — K449 Diaphragmatic hernia without obstruction or gangrene: Secondary | ICD-10-CM | POA: Diagnosis not present

## 2015-04-18 DIAGNOSIS — D123 Benign neoplasm of transverse colon: Secondary | ICD-10-CM | POA: Diagnosis not present

## 2015-05-19 DIAGNOSIS — Z125 Encounter for screening for malignant neoplasm of prostate: Secondary | ICD-10-CM | POA: Diagnosis not present

## 2015-05-19 DIAGNOSIS — E780 Pure hypercholesterolemia: Secondary | ICD-10-CM | POA: Diagnosis not present

## 2015-05-19 DIAGNOSIS — D649 Anemia, unspecified: Secondary | ICD-10-CM | POA: Diagnosis not present

## 2015-05-19 DIAGNOSIS — I10 Essential (primary) hypertension: Secondary | ICD-10-CM | POA: Diagnosis not present

## 2015-06-16 DIAGNOSIS — E119 Type 2 diabetes mellitus without complications: Secondary | ICD-10-CM | POA: Diagnosis not present

## 2015-06-16 DIAGNOSIS — H3531 Nonexudative age-related macular degeneration: Secondary | ICD-10-CM | POA: Diagnosis not present

## 2015-06-16 DIAGNOSIS — Z961 Presence of intraocular lens: Secondary | ICD-10-CM | POA: Diagnosis not present

## 2015-06-16 DIAGNOSIS — H26493 Other secondary cataract, bilateral: Secondary | ICD-10-CM | POA: Diagnosis not present

## 2015-07-16 DIAGNOSIS — Z125 Encounter for screening for malignant neoplasm of prostate: Secondary | ICD-10-CM | POA: Diagnosis not present

## 2015-07-16 DIAGNOSIS — D649 Anemia, unspecified: Secondary | ICD-10-CM | POA: Diagnosis not present

## 2015-07-16 DIAGNOSIS — I10 Essential (primary) hypertension: Secondary | ICD-10-CM | POA: Diagnosis not present

## 2015-07-16 DIAGNOSIS — E119 Type 2 diabetes mellitus without complications: Secondary | ICD-10-CM | POA: Diagnosis not present

## 2015-07-18 ENCOUNTER — Ambulatory Visit (HOSPITAL_COMMUNITY)
Admission: RE | Admit: 2015-07-18 | Discharge: 2015-07-18 | Disposition: A | Discharge status: Home / Self Care | Payer: Medicare Other | Source: Ambulatory Visit | Attending: Internal Medicine | Admitting: Internal Medicine

## 2015-07-18 ENCOUNTER — Inpatient Hospital Stay (HOSPITAL_COMMUNITY): Admission: RE | Admit: 2015-07-18 | Payer: Medicare Other | Source: Ambulatory Visit

## 2015-07-18 DIAGNOSIS — D649 Anemia, unspecified: Secondary | ICD-10-CM | POA: Insufficient documentation

## 2015-07-18 LAB — PREPARE RBC (CROSSMATCH)

## 2015-07-18 MED ORDER — ACETAMINOPHEN 325 MG PO TABS
650.0000 mg | ORAL_TABLET | Freq: Once | ORAL | Status: AC
  Administered 2015-07-18: 650 mg via ORAL
  Filled 2015-07-18: qty 2

## 2015-07-18 MED ORDER — DIPHENHYDRAMINE HCL 25 MG PO CAPS
25.0000 mg | ORAL_CAPSULE | Freq: Once | ORAL | Status: AC
  Administered 2015-07-18: 25 mg via ORAL
  Filled 2015-07-18: qty 1

## 2015-07-18 MED ORDER — SODIUM CHLORIDE 0.9 % IV SOLN
Freq: Once | INTRAVENOUS | Status: AC
  Administered 2015-07-18: 11:00:00 via INTRAVENOUS

## 2015-07-18 NOTE — Progress Notes (Signed)
Diagnosis: Anemia  MD. Dr. Maudie Mercury  Procedure: Blood drawn for type and cross match and infused 2 units of PRBCs  Condition during procedure: Pt tolerated well  Condition post procedure: Pt alert, oriented and ambulatory

## 2015-07-20 LAB — TYPE AND SCREEN
ABO/RH(D): B POS
ANTIBODY SCREEN: NEGATIVE
Unit division: 0
Unit division: 0

## 2015-07-21 DIAGNOSIS — D509 Iron deficiency anemia, unspecified: Secondary | ICD-10-CM | POA: Diagnosis not present

## 2015-07-21 DIAGNOSIS — E119 Type 2 diabetes mellitus without complications: Secondary | ICD-10-CM | POA: Diagnosis not present

## 2015-07-21 DIAGNOSIS — E780 Pure hypercholesterolemia: Secondary | ICD-10-CM | POA: Diagnosis not present

## 2015-07-21 DIAGNOSIS — I10 Essential (primary) hypertension: Secondary | ICD-10-CM | POA: Diagnosis not present

## 2015-07-21 DIAGNOSIS — D649 Anemia, unspecified: Secondary | ICD-10-CM | POA: Diagnosis not present

## 2015-07-25 ENCOUNTER — Telehealth: Payer: Self-pay | Admitting: Hematology

## 2015-07-25 NOTE — Telephone Encounter (Signed)
new patient appt-s/w patient and gave new patient for 09/20 w/arrival time of 10:45 w//Dr. Irene Limbo. Referring Dr. Maudie Mercury Dx-recurrent anemia   Referral information scanned for review

## 2015-08-12 ENCOUNTER — Encounter: Payer: Self-pay | Admitting: Hematology

## 2015-08-12 ENCOUNTER — Ambulatory Visit (HOSPITAL_BASED_OUTPATIENT_CLINIC_OR_DEPARTMENT_OTHER): Payer: Medicare Other | Admitting: Hematology

## 2015-08-12 ENCOUNTER — Other Ambulatory Visit (HOSPITAL_BASED_OUTPATIENT_CLINIC_OR_DEPARTMENT_OTHER): Payer: Medicare Other

## 2015-08-12 ENCOUNTER — Telehealth: Payer: Self-pay | Admitting: Hematology

## 2015-08-12 VITALS — BP 152/57 | HR 115 | Temp 98.1°F | Resp 18 | Ht 70.0 in | Wt 240.3 lb

## 2015-08-12 DIAGNOSIS — D500 Iron deficiency anemia secondary to blood loss (chronic): Secondary | ICD-10-CM | POA: Diagnosis not present

## 2015-08-12 DIAGNOSIS — I10 Essential (primary) hypertension: Secondary | ICD-10-CM | POA: Diagnosis not present

## 2015-08-12 DIAGNOSIS — E785 Hyperlipidemia, unspecified: Secondary | ICD-10-CM | POA: Diagnosis not present

## 2015-08-12 DIAGNOSIS — D509 Iron deficiency anemia, unspecified: Secondary | ICD-10-CM

## 2015-08-12 LAB — IRON AND TIBC CHCC
%SAT: 4 % — AB (ref 20–55)
Iron: 19 ug/dL — ABNORMAL LOW (ref 42–163)
TIBC: 456 ug/dL — ABNORMAL HIGH (ref 202–409)
UIBC: 438 ug/dL — AB (ref 117–376)

## 2015-08-12 LAB — CBC & DIFF AND RETIC
BASO%: 0.8 % (ref 0.0–2.0)
BASOS ABS: 0.1 10*3/uL (ref 0.0–0.1)
EOS ABS: 0.2 10*3/uL (ref 0.0–0.5)
EOS%: 3 % (ref 0.0–7.0)
HEMATOCRIT: 25 % — AB (ref 38.4–49.9)
HEMOGLOBIN: 6.8 g/dL — AB (ref 13.0–17.1)
IMMATURE RETIC FRACT: 19.7 % — AB (ref 3.00–10.60)
LYMPH#: 0.7 10*3/uL — AB (ref 0.9–3.3)
LYMPH%: 9.8 % — ABNORMAL LOW (ref 14.0–49.0)
MCH: 19 pg — ABNORMAL LOW (ref 27.2–33.4)
MCHC: 27.2 g/dL — ABNORMAL LOW (ref 32.0–36.0)
MCV: 70 fL — ABNORMAL LOW (ref 79.3–98.0)
MONO#: 0.5 10*3/uL (ref 0.1–0.9)
MONO%: 6.1 % (ref 0.0–14.0)
NEUT#: 6 10*3/uL (ref 1.5–6.5)
NEUT%: 80.3 % — AB (ref 39.0–75.0)
Platelets: 334 10*3/uL (ref 140–400)
RBC: 3.57 10*6/uL — ABNORMAL LOW (ref 4.20–5.82)
RDW: 21.2 % — AB (ref 11.0–14.6)
RETIC %: 1.37 % (ref 0.80–1.80)
RETIC CT ABS: 48.91 10*3/uL (ref 34.80–93.90)
WBC: 7.4 10*3/uL (ref 4.0–10.3)
nRBC: 0 % (ref 0–0)

## 2015-08-12 LAB — COMPREHENSIVE METABOLIC PANEL (CC13)
ALBUMIN: 3.6 g/dL (ref 3.5–5.0)
ALK PHOS: 77 U/L (ref 40–150)
ALT: 17 U/L (ref 0–55)
AST: 15 U/L (ref 5–34)
Anion Gap: 8 mEq/L (ref 3–11)
BUN: 19.1 mg/dL (ref 7.0–26.0)
CHLORIDE: 109 meq/L (ref 98–109)
CO2: 25 mEq/L (ref 22–29)
Calcium: 9.1 mg/dL (ref 8.4–10.4)
Creatinine: 1.1 mg/dL (ref 0.7–1.3)
EGFR: 70 mL/min/{1.73_m2} — ABNORMAL LOW (ref >90)
GLUCOSE: 122 mg/dL (ref 70–140)
POTASSIUM: 4.5 meq/L (ref 3.5–5.1)
Sodium: 142 mEq/L (ref 136–145)
Total Bilirubin: 0.39 mg/dL (ref 0.20–1.20)
Total Protein: 7 g/dL (ref 6.4–8.3)

## 2015-08-12 LAB — CHCC SMEAR

## 2015-08-12 LAB — VITAMIN B12: Vitamin B-12: 944 pg/mL — ABNORMAL HIGH (ref 211–911)

## 2015-08-12 LAB — FERRITIN CHCC: Ferritin: 7 ng/ml — ABNORMAL LOW (ref 22–316)

## 2015-08-12 NOTE — Telephone Encounter (Signed)
Gave adn printd appt sched and avs for pt for Sept thru [facility]

## 2015-08-12 NOTE — Progress Notes (Signed)
Marland Kitchen    HEMATOLOGY/ONCOLOGY CONSULTATION NOTE  Date of Service: 08/12/2015  Patient Care Team: [name], MD as PCP - General (Internal Medicine)  CHIEF COMPLAINTS/PURPOSE OF CONSULTATION:  Evaluation and management of Anemia  HISTORY OF PRESENTING ILLNESS:   James Cooke is a wonderful 74 y.o. male who has been referred to [place] by Dr .[name], MD  for evaluation and management of Anemia.  Patient is a history of hypertension, dyslipidemia, iron deficiency anemia, age-related macular degeneration who has been referred for evaluation and management of iron deficiency anemia. Patient notes that he has been transfused 9 units of PRBCs this year spread over 4 different occasions. His hemoglobin he reports was as low as 5.2 earlier this year. Patient reports that he last got a blood transfusion on 07/16/2015 for a hemoglobin of 6.3 with an MCV of 69.7. Patient's hemoglobin fluctuates from 5.2-8.5 since January this year. He notes he feels fatigued but no overt lightheadedness chest pain or dizziness. He notes that he continues to have black stools. This taking 1 tablet of ferrous sulfate daily. He is also vitamin B12, vitamin C. He was previously taking Aleve but quit that in January 2016 when his hemoglobin dropped those concern of GI bleeding. Patient had a EGD and colonoscopy in May 2016. She notes that his colonoscopy on 04/18/2015 with Dr. Paulita Fujita showed benign polyps. He was recommended repeat colonoscopy in 3-5 years. EGD done 04/18/1215 showed no overt bleeding issues.  MEDICAL HISTORY:  Past Medical History  Diagnosis Date  . Hypertension   . Hyperlipidemia   . Anemia   . Meningitis   . Iron deficiency   . Macular degeneration     SURGICAL HISTORY: Past Surgical History  Procedure Laterality Date  . Tonsillectomy    . Appendectomy    . Hernia repair    . Esophagogastroduodenoscopy    . Colonoscopy      SOCIAL HISTORY: Social History   Social History  . Marital Status:  Single    Spouse Name: N/A  . Number of Children: N/A  . Years of Education: N/A   Occupational History  . Not on file.   Social History Main Topics  . Smoking status: Never Smoker   . Smokeless tobacco: Not on file  . Alcohol Use: No  . Drug Use: Not on file  . Sexual Activity: Not on file   Other Topics Concern  . Not on file   Social History Narrative    FAMILY HISTORY: Family History  Problem Relation Age of Onset  . CAD Mother     ALLERGIES:  has No Known Allergies.  MEDICATIONS:  Current Outpatient Prescriptions  Medication Sig Dispense Refill  . ACCU-CHEK AVIVA PLUS test strip     . amLODipine (NORVASC) 5 MG tablet Take 5 mg by mouth daily.    . Ascorbic Acid (VITAMIN C) 1000 MG tablet Take 1,000 mg by mouth daily.    Marland Kitchen atorvastatin (LIPITOR) 80 MG tablet Take 80 mg by mouth daily.    . Cyanocobalamin (VITAMIN B 12 PO) Take 1 tablet by mouth daily.    . ferrous sulfate 325 (65 FE) MG tablet Take 325 mg by mouth at bedtime.    . Fish Oil-Cholecalciferol (OMEGA-3 + VITAMIN D3 PO) Take 1 tablet by mouth daily.    . fluticasone (FLONASE) 50 MCG/ACT nasal spray Place 1-2 sprays into both nostrils 2 (two) times daily as needed for allergies or rhinitis.    Marland Kitchen glimepiride (AMARYL) 2 MG tablet  Take 2 mg by mouth daily with breakfast.    . metFORMIN (GLUCOPHAGE) 500 MG tablet Take 500 mg by mouth 2 (two) times daily with a meal.    . Multiple Vitamins-Minerals (ICAPS) CAPS Take 1 capsule by mouth daily.     No current facility-administered medications for this visit.   Facility-Administered Medications Ordered in Other Visits  Medication Dose Route Frequency Provider Last Rate Last Dose  . 0.9 %  sodium chloride infusion   Intravenous Once [name], MD      . acetaminophen (TYLENOL) tablet 650 mg  650 mg Oral Once [name], MD      . diphenhydrAMINE (BENADRYL) capsule 25 mg  25 mg Oral Once [name], MD        REVIEW OF SYSTEMS:    10 Point review of Systems was  done is negative except as noted above.  PHYSICAL EXAMINATION: ECOG PERFORMANCE STATUS: 2 - Symptomatic, <50% confined to bed  . Filed Vitals:   08/12/15 1041  Height: 5\' 10"  (1.778 m)  Weight: 240 lb 4.8 oz (108.999 kg)   Filed Weights   08/12/15 1041  Weight: 240 lb 4.8 oz (108.999 kg)   .Body mass index is 34.48 kg/(m^2).  GENERAL:alert, in no acute distress and comfortable SKIN: skin color, texture, turgor are normal, no rashes or significant lesions EYES: normal, conjunctiva are pink and non-injected, sclera clear OROPHARYNX:no exudate, no erythema and lips, buccal mucosa, and tongue normal  NECK: supple, no JVD, thyroid normal size, non-tender, without nodularity LYMPH:  no palpable lymphadenopathy in the cervical, axillary or inguinal LUNGS: clear to auscultation with normal respiratory effort HEART: regular rate & rhythm,  no murmurs and no lower extremity edema ABDOMEN: abdomen soft, non-tender, normoactive bowel sounds , no hepatosplenomegaly  Musculoskeletal: no cyanosis of digits and no clubbing  PSYCH: alert & oriented x 3 with fluent speech NEURO: no focal motor/sensory deficits  LABORATORY DATA:  I have reviewed the data as listed  . CBC Latest Ref Rng 08/12/2015 12/20/2014 12/19/2014  WBC 4.0 - 10.3 10e3/uL 7.4 - -  Hemoglobin 13.0 - 17.1 g/dL 6.8(LL) 8.3(L) -  Hematocrit 38.4 - 49.9 % 25.0(L) 28.2(L) 18.7(L)  Platelets 140 - 400 10e3/uL 334 - -    . CMP Latest Ref Rng 08/12/2015 12/19/2014  Glucose 70 - 140 mg/dl 122 117(H)  BUN 7.0 - 26.0 mg/dL 19.1 23  Creatinine 0.7 - 1.3 mg/dL 1.1 1.06  Sodium 136 - 145 mEq/L 142 139  Potassium 3.5 - 5.1 mEq/L 4.5 4.5  Chloride 96 - 112 mmol/L - 109  CO2 22 - 29 mEq/L 25 22  Calcium 8.4 - 10.4 mg/dL 9.1 8.7  Total Protein 6.4 - 8.3 g/dL 7.0 6.6  Total Bilirubin 0.20 - 1.20 mg/dL 0.39 0.7  Alkaline Phos 40 - 150 U/L 77 66  AST 5 - 34 U/L 15 20  ALT 0 - 55 U/L 17 23   . Lab Results  Component Value Date   IRON  19* 08/12/2015   TIBC 456* 08/12/2015   IRONPCTSAT 4* 08/12/2015   (Iron and TIBC)  Lab Results  Component Value Date   FERRITIN 7* 08/12/2015    B12 level: 944  RADIOGRAPHIC STUDIES: I have personally reviewed the radiological images as listed and agreed with the findings in the report. No results found.  ASSESSMENT & PLAN:   74 year old with   #1 Microcytic hypochromic anemia. Hemoglobin is down to 6.8. MCV 70. Ferritin 7. Iron saturation 4%. Iron deficiency  anemia recurrent and likely due to ongoing GI blood losses. Plan -After discussing the pros and cons the patient is agreeable to receiving IV iron. -Patient would receive 2 doses of IV Feraheme , 1 weekly 2. -Recheck labs in 1 week and 2 weeks to ensure that the hemoglobin isn't dropping further. -Patient was counseled to call us immediately if a gets short of breath as chest pain or gets more dizzy and we will proceed to transfuse PRBCs. -We'll monitor weekly labs symptoms to check for need of PRBC transfusion. -Continue GI follow-up. -In addition to the weekly CBC 2 he will need repeat labs in 6-8 weeks to check response to IV iron and follow-up with Dr. Irene Limbo in 8 weeks. -Avoid NSAIDs and other blood thinners.   -Continued close follow-up with primary care physician .   All of the patients questions were answered to hispparent satisfaction. The patient knows to call the clinic with any problems, questions or concerns.  I spent 35 minutes counseling the patient face to face. The total time spent in the appointment was 45 minutes and more than 50% was on counseling and direct patient cares.    Sullivan Lone MD [place] AAHIVMS [facility] Hematology/Oncology Physician [facility]  (Office):       [phone] (Work cell):  [phone] (Fax):           [phone]  08/12/2015 10:47 AM

## 2015-08-20 ENCOUNTER — Other Ambulatory Visit: Payer: Self-pay | Admitting: *Deleted

## 2015-08-20 ENCOUNTER — Ambulatory Visit (HOSPITAL_BASED_OUTPATIENT_CLINIC_OR_DEPARTMENT_OTHER): Payer: Medicare Other

## 2015-08-20 VITALS — BP 136/59 | HR 87 | Temp 98.1°F | Resp 20

## 2015-08-20 DIAGNOSIS — D649 Anemia, unspecified: Secondary | ICD-10-CM

## 2015-08-20 DIAGNOSIS — D500 Iron deficiency anemia secondary to blood loss (chronic): Secondary | ICD-10-CM

## 2015-08-20 MED ORDER — SODIUM CHLORIDE 0.9 % IV SOLN
Freq: Once | INTRAVENOUS | Status: AC
  Administered 2015-08-20: 12:00:00 via INTRAVENOUS

## 2015-08-20 MED ORDER — SODIUM CHLORIDE 0.9 % IV SOLN
510.0000 mg | Freq: Once | INTRAVENOUS | Status: AC
  Administered 2015-08-20: 510 mg via INTRAVENOUS
  Filled 2015-08-20: qty 17

## 2015-08-20 NOTE — Patient Instructions (Signed)
Ferumoxytol injection What is this medicine? FERUMOXYTOL is an iron complex. Iron is used to make healthy red blood cells, which carry oxygen and nutrients throughout the body. This medicine is used to treat iron deficiency anemia in people with chronic kidney disease. This medicine may be used for other purposes; ask your health care Terralyn Matsumura or pharmacist if you have questions. COMMON BRAND NAME(S): Feraheme What should I tell my health care Aashna Matson before I take this medicine? They need to know if you have any of these conditions: -anemia not caused by low iron levels -high levels of iron in the blood -magnetic resonance imaging (MRI) test scheduled -an unusual or allergic reaction to iron, other medicines, foods, dyes, or preservatives -pregnant or trying to get pregnant -breast-feeding How should I use this medicine? This medicine is for injection into a vein. It is given by a health care professional in a hospital or clinic setting. Talk to your pediatrician regarding the use of this medicine in children. Special care may be needed. Overdosage: If you think you've taken too much of this medicine contact a poison control center or emergency room at once. Overdosage: If you think you have taken too much of this medicine contact a poison control center or emergency room at once. NOTE: This medicine is only for you. Do not share this medicine with others. What if I miss a dose? It is important not to miss your dose. Call your doctor or health care professional if you are unable to keep an appointment. What may interact with this medicine? This medicine may interact with the following medications: -other iron products This list may not describe all possible interactions. Give your health care Alaja Goldinger a list of all the medicines, herbs, non-prescription drugs, or dietary supplements you use. Also tell them if you smoke, drink alcohol, or use illegal drugs. Some items may interact with your  medicine. What should I watch for while using this medicine? Visit your doctor or healthcare professional regularly. Tell your doctor or healthcare professional if your symptoms do not start to get better or if they get worse. You may need blood work done while you are taking this medicine. You may need to follow a special diet. Talk to your doctor. Foods that contain iron include: whole grains/cereals, dried fruits, beans, or peas, leafy green vegetables, and organ meats (liver, kidney). What side effects may I notice from receiving this medicine? Side effects that you should report to your doctor or health care professional as soon as possible: -allergic reactions like skin rash, itching or hives, swelling of the face, lips, or tongue -breathing problems -changes in blood pressure -feeling faint or lightheaded, falls -fever or chills -flushing, sweating, or hot feelings -swelling of the ankles or feet Side effects that usually do not require medical attention (Report these to your doctor or health care professional if they continue or are bothersome.): -diarrhea -headache -nausea, vomiting -stomach pain This list may not describe all possible side effects. Call your doctor for medical advice about side effects. You may report side effects to FDA at 1-800-FDA-1088. Where should I keep my medicine? This drug is given in a hospital or clinic and will not be stored at home. NOTE: This sheet is a summary. It may not cover all possible information. If you have questions about this medicine, talk to your doctor, pharmacist, or health care Samarra Ridgely.  2015, Elsevier/Gold Standard. (2012-06-23 15:23:36)  

## 2015-08-21 ENCOUNTER — Telehealth: Payer: Self-pay | Admitting: Hematology

## 2015-08-21 NOTE — Telephone Encounter (Signed)
Appointments made and patient aware of lab

## 2015-08-27 ENCOUNTER — Other Ambulatory Visit (HOSPITAL_BASED_OUTPATIENT_CLINIC_OR_DEPARTMENT_OTHER): Payer: Medicare Other

## 2015-08-27 ENCOUNTER — Ambulatory Visit (HOSPITAL_BASED_OUTPATIENT_CLINIC_OR_DEPARTMENT_OTHER): Payer: Medicare Other

## 2015-08-27 VITALS — BP 137/64 | HR 81 | Temp 98.0°F | Resp 18

## 2015-08-27 DIAGNOSIS — D500 Iron deficiency anemia secondary to blood loss (chronic): Secondary | ICD-10-CM | POA: Diagnosis not present

## 2015-08-27 LAB — CBC & DIFF AND RETIC
BASO%: 1.2 % (ref 0.0–2.0)
Basophils Absolute: 0.1 10*3/uL (ref 0.0–0.1)
EOS ABS: 0.3 10*3/uL (ref 0.0–0.5)
EOS%: 4.2 % (ref 0.0–7.0)
HCT: 26.1 % — ABNORMAL LOW (ref 38.4–49.9)
HGB: 7.4 g/dL — ABNORMAL LOW (ref 13.0–17.1)
IMMATURE RETIC FRACT: 26.6 % — AB (ref 3.00–10.60)
LYMPH#: 0.6 10*3/uL — AB (ref 0.9–3.3)
LYMPH%: 9.7 % — ABNORMAL LOW (ref 14.0–49.0)
MCH: 20.6 pg — ABNORMAL LOW (ref 27.2–33.4)
MCHC: 28.3 g/dL — ABNORMAL LOW (ref 32.0–36.0)
MCV: 73 fL — AB (ref 79.3–98.0)
MONO#: 0.5 10*3/uL (ref 0.1–0.9)
MONO%: 7.3 % (ref 0.0–14.0)
NEUT#: 4.9 10*3/uL (ref 1.5–6.5)
NEUT%: 77.6 % — ABNORMAL HIGH (ref 39.0–75.0)
NRBC: 0 % (ref 0–0)
Platelets: 344 10*3/uL (ref 140–400)
RBC: 3.57 10*6/uL — AB (ref 4.20–5.82)
RDW: 28.1 % — AB (ref 11.0–14.6)
RETIC %: 5.35 % — AB (ref 0.80–1.80)
Retic Ct Abs: 191 10*3/uL — ABNORMAL HIGH (ref 34.80–93.90)
WBC: 6.4 10*3/uL (ref 4.0–10.3)

## 2015-08-27 MED ORDER — SODIUM CHLORIDE 0.9 % IV SOLN
Freq: Once | INTRAVENOUS | Status: AC
  Administered 2015-08-27: 11:00:00 via INTRAVENOUS

## 2015-08-27 MED ORDER — SODIUM CHLORIDE 0.9 % IV SOLN
510.0000 mg | Freq: Once | INTRAVENOUS | Status: AC
  Administered 2015-08-27: 510 mg via INTRAVENOUS
  Filled 2015-08-27: qty 17

## 2015-08-27 NOTE — Patient Instructions (Signed)

## 2015-09-01 DIAGNOSIS — D649 Anemia, unspecified: Secondary | ICD-10-CM | POA: Diagnosis not present

## 2015-09-03 ENCOUNTER — Other Ambulatory Visit (HOSPITAL_BASED_OUTPATIENT_CLINIC_OR_DEPARTMENT_OTHER): Payer: Medicare Other

## 2015-09-03 DIAGNOSIS — D500 Iron deficiency anemia secondary to blood loss (chronic): Secondary | ICD-10-CM | POA: Diagnosis not present

## 2015-09-03 DIAGNOSIS — D649 Anemia, unspecified: Secondary | ICD-10-CM

## 2015-09-03 LAB — CBC WITH DIFFERENTIAL/PLATELET
BASO%: 1.4 % (ref 0.0–2.0)
Basophils Absolute: 0.1 10*3/uL (ref 0.0–0.1)
EOS%: 5.4 % (ref 0.0–7.0)
Eosinophils Absolute: 0.3 10*3/uL (ref 0.0–0.5)
HEMATOCRIT: 32.1 % — AB (ref 38.4–49.9)
HGB: 9.6 g/dL — ABNORMAL LOW (ref 13.0–17.1)
LYMPH%: 12.2 % — ABNORMAL LOW (ref 14.0–49.0)
MCH: 23.1 pg — ABNORMAL LOW (ref 27.2–33.4)
MCHC: 29.9 g/dL — AB (ref 32.0–36.0)
MCV: 77.1 fL — ABNORMAL LOW (ref 79.3–98.0)
MONO#: 0.3 10*3/uL (ref 0.1–0.9)
MONO%: 6.2 % (ref 0.0–14.0)
NEUT%: 74.8 % (ref 39.0–75.0)
NEUTROS ABS: 4.1 10*3/uL (ref 1.5–6.5)
Platelets: 375 10*3/uL (ref 140–400)
RBC: 4.17 10*6/uL — ABNORMAL LOW (ref 4.20–5.82)
RDW: 35.9 % — ABNORMAL HIGH (ref 11.0–14.6)
WBC: 5.5 10*3/uL (ref 4.0–10.3)
lymph#: 0.7 10*3/uL — ABNORMAL LOW (ref 0.9–3.3)

## 2015-09-05 DIAGNOSIS — E119 Type 2 diabetes mellitus without complications: Secondary | ICD-10-CM | POA: Diagnosis not present

## 2015-09-05 DIAGNOSIS — E7800 Pure hypercholesterolemia, unspecified: Secondary | ICD-10-CM | POA: Diagnosis not present

## 2015-09-05 DIAGNOSIS — Z23 Encounter for immunization: Secondary | ICD-10-CM | POA: Diagnosis not present

## 2015-09-05 DIAGNOSIS — I10 Essential (primary) hypertension: Secondary | ICD-10-CM | POA: Diagnosis not present

## 2015-10-06 ENCOUNTER — Other Ambulatory Visit: Payer: Self-pay | Admitting: *Deleted

## 2015-10-06 DIAGNOSIS — D500 Iron deficiency anemia secondary to blood loss (chronic): Secondary | ICD-10-CM

## 2015-10-07 ENCOUNTER — Other Ambulatory Visit (HOSPITAL_BASED_OUTPATIENT_CLINIC_OR_DEPARTMENT_OTHER): Payer: Medicare Other

## 2015-10-07 ENCOUNTER — Ambulatory Visit (HOSPITAL_BASED_OUTPATIENT_CLINIC_OR_DEPARTMENT_OTHER): Payer: Medicare Other | Admitting: Hematology

## 2015-10-07 ENCOUNTER — Encounter: Payer: Self-pay | Admitting: Hematology

## 2015-10-07 ENCOUNTER — Telehealth: Payer: Self-pay | Admitting: Hematology

## 2015-10-07 VITALS — BP 162/57 | HR 80 | Temp 98.3°F | Resp 18 | Ht 70.0 in | Wt 245.3 lb

## 2015-10-07 DIAGNOSIS — D500 Iron deficiency anemia secondary to blood loss (chronic): Secondary | ICD-10-CM

## 2015-10-07 LAB — CBC & DIFF AND RETIC
BASO%: 0.8 % (ref 0.0–2.0)
Basophils Absolute: 0 10*3/uL (ref 0.0–0.1)
EOS%: 5.7 % (ref 0.0–7.0)
Eosinophils Absolute: 0.3 10*3/uL (ref 0.0–0.5)
HCT: 27.7 % — ABNORMAL LOW (ref 38.4–49.9)
HGB: 8 g/dL — ABNORMAL LOW (ref 13.0–17.1)
IMMATURE RETIC FRACT: 15.6 % — AB (ref 3.00–10.60)
LYMPH%: 10.7 % — AB (ref 14.0–49.0)
MCH: 21.7 pg — AB (ref 27.2–33.4)
MCHC: 28.9 g/dL — ABNORMAL LOW (ref 32.0–36.0)
MCV: 75.3 fL — AB (ref 79.3–98.0)
MONO#: 0.3 10*3/uL (ref 0.1–0.9)
MONO%: 6.2 % (ref 0.0–14.0)
NEUT%: 76.6 % — AB (ref 39.0–75.0)
NEUTROS ABS: 3.9 10*3/uL (ref 1.5–6.5)
PLATELETS: 280 10*3/uL (ref 140–400)
RBC: 3.68 10*6/uL — AB (ref 4.20–5.82)
RDW: 22 % — ABNORMAL HIGH (ref 11.0–14.6)
Retic %: 1.32 % (ref 0.80–1.80)
Retic Ct Abs: 48.58 10*3/uL (ref 34.80–93.90)
WBC: 5.1 10*3/uL (ref 4.0–10.3)
lymph#: 0.6 10*3/uL — ABNORMAL LOW (ref 0.9–3.3)

## 2015-10-07 LAB — IRON AND TIBC CHCC: TIBC: 440 ug/dL — ABNORMAL HIGH (ref 202–409)

## 2015-10-07 LAB — FERRITIN CHCC: Ferritin: 12 ng/ml — ABNORMAL LOW (ref 22–316)

## 2015-10-07 NOTE — Telephone Encounter (Signed)
per pof to sch pt appt-gave pt copy of avs °

## 2015-10-30 ENCOUNTER — Telehealth: Payer: Self-pay | Admitting: Hematology

## 2015-10-30 NOTE — Telephone Encounter (Signed)
per pof to sch pt appt-cld * spoke to pt and pt stated to hold off he wnted to speak to nurse first-trans to [facility]

## 2015-10-30 NOTE — Progress Notes (Signed)
James Cooke    HEMATOLOGY/ONCOLOGY CLINIC NOTE  Date of Service:.10/07/2015  Patient Care Team: James Gravel, MD as PCP - General (Internal Medicine)  CHIEF COMPLAINTS/PURPOSE OF CONSULTATION:  Evaluation and management of Anemia  DIAGNOSIS 1) Iron deficiency anemia due to GI blood losses Patient had a EGD and colonoscopy in May 2016.He notes that his colonoscopy on 04/18/2015 with Dr. Paulita Cooke showed benign polyps. He was recommended repeat colonoscopy in 3-5 years. EGD done 04/18/1215 showed no overt bleeding issues.  Current Treatment IV feraheme as needed  HISTORY OF PRESENTING ILLNESS:  (Plz see initial consultation for details regarding initial presentation)  INTERVAL HISTORY  James Cooke is here for f/u regarding his iron deficiency anemia. He notes no overt blood in the stools or black stools. He notes that he felt better for a little while after the IV iron but has started to feel weaker again.  MEDICAL HISTORY:  Past Medical History  Diagnosis Date  . Hypertension   . Hyperlipidemia   . Anemia   . Meningitis   . Iron deficiency   . Macular degeneration     SURGICAL HISTORY: Past Surgical History  Procedure Laterality Date  . Tonsillectomy    . Appendectomy    . Hernia repair    . Esophagogastroduodenoscopy    . Colonoscopy      SOCIAL HISTORY: Social History   Social History  . Marital Status: Single    Spouse Name: N/A  . Number of Children: N/A  . Years of Education: N/A   Occupational History  . Not on file.   Social History Main Topics  . Smoking status: Never Smoker   . Smokeless tobacco: Never Used  . Alcohol Use: No  . Drug Use: Not on file  . Sexual Activity: No   Other Topics Concern  . Not on file   Social History Narrative    FAMILY HISTORY: Family History  Problem Relation Age of Onset  . CAD Mother     ALLERGIES:  has No Known Allergies.  MEDICATIONS:  Current Outpatient Prescriptions  Medication Sig Dispense Refill  . ACCU-CHEK  AVIVA PLUS test strip     . amLODipine (NORVASC) 5 MG tablet Take 2.5 mg by mouth daily.     . Ascorbic Acid (VITAMIN C) 1000 MG tablet Take 1,000 mg by mouth daily.    James Cooke atorvastatin (LIPITOR) 80 MG tablet Take 80 mg by mouth daily.    . Cyanocobalamin (VITAMIN B 12 PO) Take 1 tablet by mouth once a week.     . Fish Oil-Cholecalciferol (OMEGA-3 + VITAMIN D3 PO) Take 1 tablet by mouth daily.    . fluticasone (FLONASE) 50 MCG/ACT nasal spray Place 1-2 sprays into both nostrils 2 (two) times daily as needed for allergies or rhinitis.    James Cooke glimepiride (AMARYL) 2 MG tablet Take 2 mg by mouth daily with breakfast.    . metFORMIN (GLUCOPHAGE) 500 MG tablet Take 500 mg by mouth 2 (two) times daily with a meal.    . Multiple Vitamins-Minerals (ICAPS) CAPS Take 1 capsule by mouth daily.     No current facility-administered medications for this visit.   Facility-Administered Medications Ordered in Other Visits  Medication Dose Route Frequency Provider Last Rate Last Dose  . 0.9 %  sodium chloride infusion   Intravenous Once James Gravel, MD      . acetaminophen (TYLENOL) tablet 650 mg  650 mg Oral Once James Gravel, MD      . diphenhydrAMINE (BENADRYL) capsule  25 mg  25 mg Oral Once James Gravel, MD        REVIEW OF SYSTEMS:    10 Point review of Systems was done is negative except as noted above.  PHYSICAL EXAMINATION: ECOG PERFORMANCE STATUS: 2 - Symptomatic, <50% confined to bed  . Filed Vitals:   10/07/15 1022  Height: 5\' 10"  (1.778 m)  Weight: 245 lb 4.8 oz (111.267 kg)   Filed Weights   10/07/15 1022  Weight: 245 lb 4.8 oz (111.267 kg)   .Body mass index is 35.2 kg/(m^2).  GENERAL:alert, in no acute distress and comfortable SKIN: skin color, texture, turgor are normal, no rashes or significant lesions EYES: normal, conjunctiva are pink and non-injected, sclera clear OROPHARYNX:no exudate, no erythema and lips, buccal mucosa, and tongue normal  NECK: supple, no JVD, thyroid normal size,  non-tender, without nodularity LYMPH:  no palpable lymphadenopathy in the cervical, axillary or inguinal LUNGS: clear to auscultation with normal respiratory effort HEART: regular rate & rhythm,  no murmurs and no lower extremity edema ABDOMEN: abdomen soft, non-tender, normoactive bowel sounds , no hepatosplenomegaly  Musculoskeletal: no cyanosis of digits and no clubbing  PSYCH: alert & oriented x 3 with fluent speech NEURO: no focal motor/sensory deficits  LABORATORY DATA:  I have reviewed the data as listed  . CBC Latest Ref Rng 10/07/2015 09/03/2015 08/27/2015  WBC 4.0 - 10.3 10e3/uL 5.1 5.5 6.4  Hemoglobin 13.0 - 17.1 g/dL 8.0(L) 9.6(L) 7.4(L)  Hematocrit 38.4 - 49.9 % 27.7(L) 32.1(L) 26.1(L)  Platelets 140 - 400 10e3/uL 280 375 344   . CBC    Component Value Date/Time   WBC 5.1 10/07/2015 0948   WBC 7.8 12/03/2008 1505   RBC 3.68* 10/07/2015 0948   RBC 4.57 12/03/2008 1505   HGB 8.0* 10/07/2015 0948   HGB 8.3* 12/20/2014 0930   HCT 27.7* 10/07/2015 0948   HCT 28.2* 12/20/2014 0930   HCT 18.7* 12/19/2014 1830   PLT 280 10/07/2015 0948   PLT 464* 12/03/2008 1505   MCV 75.3* 10/07/2015 0948   MCV 69.5* 12/03/2008 1505   MCH 21.7* 10/07/2015 0948   MCHC 28.9* 10/07/2015 0948   MCHC 30.5 12/03/2008 1505   RDW 22.0* 10/07/2015 0948   RDW 25.9* 12/03/2008 1505   LYMPHSABS 0.6* 10/07/2015 0948   MONOABS 0.3 10/07/2015 0948   EOSABS 0.3 10/07/2015 0948   BASOSABS 0.0 10/07/2015 0948     . CMP Latest Ref Rng 08/12/2015 12/19/2014  Glucose 70 - 140 mg/dl 122 117(H)  BUN 7.0 - 26.0 mg/dL 19.1 23  Creatinine 0.7 - 1.3 mg/dL 1.1 1.06  Sodium 136 - 145 mEq/L 142 139  Potassium 3.5 - 5.1 mEq/L 4.5 4.5  Chloride 96 - 112 mmol/L - 109  CO2 22 - 29 mEq/L 25 22  Calcium 8.4 - 10.4 mg/dL 9.1 8.7  Total Protein 6.4 - 8.3 g/dL 7.0 6.6  Total Bilirubin 0.20 - 1.20 mg/dL 0.39 0.7  Alkaline Phos 40 - 150 U/L 77 66  AST 5 - 34 U/L 15 20  ALT 0 - 55 U/L 17 23   . Lab Results    Component Value Date   IRON <11* 10/07/2015   TIBC 440* 10/07/2015   IRONPCTSAT Not calculated due to Iron < linear range. 10/07/2015   (Iron and TIBC)  Lab Results  Component Value Date   FERRITIN 12* 10/07/2015    B12 level: 944  RADIOGRAPHIC STUDIES: I have personally reviewed the radiological images as listed and agreed with  the findings in the report. No results found.  ASSESSMENT & PLAN:   74 year old with   #1 Microcytic hypochromic anemia. Hemoglobin is down to 6.8. MCV 70. Ferritin 7. Iron saturation 4%. Iron deficiency anemia recurrent and likely due to ongoing GI blood losses. No clearly evident GI bleeding. Hgb had improved to 9.6 with IV feraheme but then droppped back down to 8. Ferritin only marginally improved to 12 with low iron saturation.  Plan -we will setup patient for IV feraheme x 3 additional doses to completely address his iron deficiency. -Patient was counseled to call us immediately if a gets short of breath as chest pain or gets more dizzy and we will proceed to transfuse PRBCs. -he will need GI followup to evaluate for other source off ongoing blood loss ? Need for capsule endoscopy. -absolutely avoid NSAIDs and other blood thinners. -Continued close follow-up with primary care physician .  RTC with Dr Irene Limbo in 2 months with cbc, cmp and ferritin,iron profile.   All of the patients questions were answered to hispparent satisfaction. The patient knows to call the clinic with any problems, questions or concerns.  I spent 20 minutes counseling the patient face to face. The total time spent in the appointment was 25 minutes and more than 50% was on counseling and direct patient cares.    Sullivan Lone MD [place] AAHIVMS [facility] Hematology/Oncology Physician [facility]  (Office):       [phone] (Work cell):  [phone] (Fax):           [phone]  10/30/2015 1:30 AM

## 2015-10-31 ENCOUNTER — Other Ambulatory Visit: Payer: Self-pay | Admitting: *Deleted

## 2015-10-31 ENCOUNTER — Other Ambulatory Visit: Payer: Self-pay

## 2015-10-31 ENCOUNTER — Ambulatory Visit (HOSPITAL_BASED_OUTPATIENT_CLINIC_OR_DEPARTMENT_OTHER): Payer: Medicare Other

## 2015-10-31 DIAGNOSIS — D500 Iron deficiency anemia secondary to blood loss (chronic): Secondary | ICD-10-CM

## 2015-10-31 DIAGNOSIS — D509 Iron deficiency anemia, unspecified: Secondary | ICD-10-CM | POA: Diagnosis not present

## 2015-10-31 LAB — FECAL OCCULT BLOOD, GUAIAC: Occult Blood: POSITIVE

## 2015-11-03 ENCOUNTER — Other Ambulatory Visit: Payer: Self-pay | Admitting: *Deleted

## 2015-11-04 ENCOUNTER — Telehealth: Payer: Self-pay | Admitting: Hematology

## 2015-11-04 ENCOUNTER — Telehealth: Payer: Self-pay | Admitting: *Deleted

## 2015-11-04 NOTE — Telephone Encounter (Signed)
Pt was called to set up IV feraheme appointments.  Pt stated he is currently on study with gastroenterology stating not to take iron supplements.  This RN instructed pt to inform us when study was over if he is still interested in receiving iron infusion.  Pt verbalized understanding.

## 2015-11-04 NOTE — Telephone Encounter (Signed)
Called patient and he is aware of his iron and request to s/w the nurse as he feels this is not right    James Cooke

## 2015-11-05 ENCOUNTER — Ambulatory Visit: Payer: Self-pay

## 2015-11-10 ENCOUNTER — Encounter (HOSPITAL_COMMUNITY)
Admission: RE | Disposition: A | Discharge status: Home / Self Care | Payer: Self-pay | Source: Ambulatory Visit | Attending: Gastroenterology

## 2015-11-10 ENCOUNTER — Ambulatory Visit (HOSPITAL_COMMUNITY)
Admission: RE | Admit: 2015-11-10 | Discharge: 2015-11-10 | Disposition: A | Discharge status: Home / Self Care | Payer: Medicare Other | Source: Ambulatory Visit | Attending: Gastroenterology | Admitting: Gastroenterology

## 2015-11-10 DIAGNOSIS — D500 Iron deficiency anemia secondary to blood loss (chronic): Secondary | ICD-10-CM | POA: Insufficient documentation

## 2015-11-10 HISTORY — PX: GIVENS CAPSULE STUDY: SHX5432

## 2015-11-10 SURGERY — IMAGING PROCEDURE, GI TRACT, INTRALUMINAL, VIA CAPSULE
Anesthesia: LOCAL

## 2015-11-10 SURGICAL SUPPLY — 1 items: TOWEL COTTON PACK 4EA (MISCELLANEOUS) ×4 IMPLANT

## 2015-11-11 ENCOUNTER — Encounter (HOSPITAL_COMMUNITY): Payer: Self-pay | Admitting: Gastroenterology

## 2015-11-12 ENCOUNTER — Ambulatory Visit: Payer: Self-pay

## 2015-11-13 ENCOUNTER — Ambulatory Visit (HOSPITAL_COMMUNITY)
Admission: RE | Admit: 2015-11-13 | Discharge: 2015-11-13 | Disposition: A | Discharge status: Home / Self Care | Payer: Medicare Other | Source: Ambulatory Visit | Attending: Internal Medicine | Admitting: Internal Medicine

## 2015-11-13 DIAGNOSIS — D649 Anemia, unspecified: Secondary | ICD-10-CM | POA: Insufficient documentation

## 2015-11-13 LAB — PREPARE RBC (CROSSMATCH)

## 2015-11-14 ENCOUNTER — Encounter (HOSPITAL_COMMUNITY)
Admission: RE | Admit: 2015-11-14 | Discharge: 2015-11-14 | Disposition: A | Discharge status: Home / Self Care | Payer: Medicare Other | Source: Ambulatory Visit | Attending: Internal Medicine | Admitting: Internal Medicine

## 2015-11-14 DIAGNOSIS — D649 Anemia, unspecified: Secondary | ICD-10-CM | POA: Insufficient documentation

## 2015-11-14 DIAGNOSIS — K921 Melena: Secondary | ICD-10-CM | POA: Diagnosis not present

## 2015-11-14 MED ORDER — SODIUM CHLORIDE 0.9 % IV SOLN
Freq: Once | INTRAVENOUS | Status: AC
  Administered 2015-11-14: 09:00:00 via INTRAVENOUS

## 2015-11-14 NOTE — Progress Notes (Signed)
IV D/C'ed by Pat,NTII; d/ced intact and site u

## 2015-11-15 LAB — TYPE AND SCREEN
ABO/RH(D): B POS
Antibody Screen: POSITIVE
DAT, IGG: NEGATIVE
DONOR AG TYPE: NEGATIVE
DONOR AG TYPE: NEGATIVE
PT AG TYPE: NEGATIVE
Unit division: 0
Unit division: 0

## 2015-11-19 ENCOUNTER — Ambulatory Visit: Payer: Self-pay

## 2015-11-27 ENCOUNTER — Ambulatory Visit (HOSPITAL_BASED_OUTPATIENT_CLINIC_OR_DEPARTMENT_OTHER): Payer: Medicare Other

## 2015-11-27 ENCOUNTER — Other Ambulatory Visit: Payer: Self-pay | Admitting: *Deleted

## 2015-11-27 ENCOUNTER — Other Ambulatory Visit: Payer: Self-pay | Admitting: Gastroenterology

## 2015-11-27 DIAGNOSIS — D500 Iron deficiency anemia secondary to blood loss (chronic): Secondary | ICD-10-CM | POA: Diagnosis not present

## 2015-11-27 LAB — URINALYSIS, MICROSCOPIC - CHCC
BLOOD: NEGATIVE
Bilirubin (Urine): NEGATIVE
Glucose: NEGATIVE mg/dL
Ketones: NEGATIVE mg/dL
Leukocyte Esterase: NEGATIVE
Nitrite: NEGATIVE
PH: 6 (ref 4.6–8.0)
PROTEIN: NEGATIVE mg/dL
RBC / HPF: NEGATIVE (ref 0–2)
SPECIFIC GRAVITY, URINE: 1.01 (ref 1.003–1.035)
UROBILINOGEN UR: 0.2 mg/dL (ref 0.2–1)
WBC, UA: NEGATIVE (ref 0–2)

## 2015-11-28 ENCOUNTER — Encounter (HOSPITAL_COMMUNITY): Payer: Self-pay | Admitting: *Deleted

## 2015-11-28 NOTE — Addendum Note (Signed)
Addended by: Arta Silence on: 11/28/2015 02:12 PM   Modules accepted: Orders

## 2015-12-05 DIAGNOSIS — D649 Anemia, unspecified: Secondary | ICD-10-CM | POA: Diagnosis not present

## 2015-12-08 ENCOUNTER — Other Ambulatory Visit (HOSPITAL_BASED_OUTPATIENT_CLINIC_OR_DEPARTMENT_OTHER): Payer: Medicare Other

## 2015-12-08 ENCOUNTER — Telehealth: Payer: Self-pay | Admitting: Hematology

## 2015-12-08 ENCOUNTER — Encounter: Payer: Self-pay | Admitting: Hematology

## 2015-12-08 ENCOUNTER — Other Ambulatory Visit: Payer: Self-pay | Admitting: *Deleted

## 2015-12-08 ENCOUNTER — Ambulatory Visit (HOSPITAL_BASED_OUTPATIENT_CLINIC_OR_DEPARTMENT_OTHER): Payer: Medicare Other | Admitting: Hematology

## 2015-12-08 VITALS — BP 129/62 | HR 101 | Temp 97.8°F | Resp 18 | Ht 70.0 in | Wt 245.0 lb

## 2015-12-08 DIAGNOSIS — D500 Iron deficiency anemia secondary to blood loss (chronic): Secondary | ICD-10-CM

## 2015-12-08 DIAGNOSIS — D649 Anemia, unspecified: Secondary | ICD-10-CM

## 2015-12-08 DIAGNOSIS — D509 Iron deficiency anemia, unspecified: Secondary | ICD-10-CM | POA: Diagnosis not present

## 2015-12-08 DIAGNOSIS — K922 Gastrointestinal hemorrhage, unspecified: Secondary | ICD-10-CM | POA: Insufficient documentation

## 2015-12-08 LAB — CBC & DIFF AND RETIC
BASO%: 1.8 % (ref 0.0–2.0)
Basophils Absolute: 0.1 10*3/uL (ref 0.0–0.1)
EOS%: 8.5 % — AB (ref 0.0–7.0)
Eosinophils Absolute: 0.5 10*3/uL (ref 0.0–0.5)
HCT: 26 % — ABNORMAL LOW (ref 38.4–49.9)
HGB: 7.3 g/dL — ABNORMAL LOW (ref 13.0–17.1)
Immature Retic Fract: 23.1 % — ABNORMAL HIGH (ref 3.00–10.60)
LYMPH#: 0.6 10*3/uL — AB (ref 0.9–3.3)
LYMPH%: 10 % — ABNORMAL LOW (ref 14.0–49.0)
MCH: 19.8 pg — AB (ref 27.2–33.4)
MCHC: 28.1 g/dL — AB (ref 32.0–36.0)
MCV: 70.5 fL — ABNORMAL LOW (ref 79.3–98.0)
MONO#: 0.7 10*3/uL (ref 0.1–0.9)
MONO%: 10.8 % (ref 0.0–14.0)
NEUT#: 4.3 10*3/uL (ref 1.5–6.5)
NEUT%: 68.9 % (ref 39.0–75.0)
PLATELETS: 330 10*3/uL (ref 140–400)
RBC: 3.69 10*6/uL — ABNORMAL LOW (ref 4.20–5.82)
RDW: 22.7 % — AB (ref 11.0–14.6)
RETIC %: 1.11 % (ref 0.80–1.80)
RETIC CT ABS: 40.96 10*3/uL (ref 34.80–93.90)
WBC: 6.2 10*3/uL (ref 4.0–10.3)

## 2015-12-08 LAB — COMPREHENSIVE METABOLIC PANEL
ALT: 18 U/L (ref 0–55)
ANION GAP: 10 meq/L (ref 3–11)
AST: 15 U/L (ref 5–34)
Albumin: 3.6 g/dL (ref 3.5–5.0)
Alkaline Phosphatase: 91 U/L (ref 40–150)
BUN: 12.4 mg/dL (ref 7.0–26.0)
CHLORIDE: 105 meq/L (ref 98–109)
CO2: 25 mEq/L (ref 22–29)
CREATININE: 1.2 mg/dL (ref 0.7–1.3)
Calcium: 8.9 mg/dL (ref 8.4–10.4)
EGFR: 61 mL/min/{1.73_m2} — ABNORMAL LOW (ref >90)
Glucose: 161 mg/dl — ABNORMAL HIGH (ref 70–140)
POTASSIUM: 4.4 meq/L (ref 3.5–5.1)
Sodium: 140 mEq/L (ref 136–145)
Total Bilirubin: 0.49 mg/dL (ref 0.20–1.20)
Total Protein: 7.3 g/dL (ref 6.4–8.3)

## 2015-12-08 LAB — FERRITIN

## 2015-12-08 MED ORDER — DOCUSATE SODIUM 100 MG PO CAPS: 200.0000 mg | ORAL_CAPSULE | Freq: Every evening | ORAL | Status: AC | PRN

## 2015-12-08 NOTE — Progress Notes (Signed)
James Cooke    HEMATOLOGY/ONCOLOGY CLINIC NOTE  Date of Service:. 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 Paulita Fujita showed benign polyps. He was recommended repeat colonoscopy in 3-5 years. EGD done 04/18/1215 showed no overt bleeding issues.  Current Treatment IV feraheme as needed  HISTORY OF PRESENTING ILLNESS:  (Plz see initial consultation for details regarding initial presentation)  INTERVAL HISTORY  James Cooke is here for f/u regarding his iron deficiency anemia. He notes no overt blood in the stools or black stools he had fecal occult blood testing done on 10/31/2015 that showed positive occult blood 2. UA did not show any hematuria. No other evidence of overt bleeding. No other overt bleeding tendencies/skin bruising/gum bleeds etc. He was offered additional IV iron infusions but wanted to hold off when called in early December. He has been seeing Dr. Paulita Fujita and had a capsule endoscopy done on 11/10/2015 which showed no signs of overt bleeding or obvious lesions . Patient notes that he has been scheduled for a repeat EGD on 12/10/2015. He notes that he would like to get his IV iron again and would like to schedule this as soon as possible. Notes that his stools have been hard and was prescribed stool softeners. He did receive 1 unit of blood for transfusion on 11/15/2015. Notes fatigue but no lightheadedness or dizziness no chest pain no overt shortness of breath with his baseline activity level. No bright red blood per rectum. No overt melena.   MEDICAL HISTORY:  Past Medical History  Diagnosis Date  . Hypertension   . Hyperlipidemia   . Anemia   . Meningitis   . Iron deficiency     . Macular degeneration   . Diabetes mellitus without complication ([place])     SURGICAL HISTORY: Past Surgical History  Procedure Laterality Date  . Tonsillectomy    . Appendectomy    . Hernia repair    . Esophagogastroduodenoscopy    . Colonoscopy    . Givens capsule study N/A 11/10/2015    Procedure: GIVENS CAPSULE STUDY;  Surgeon: Arta Silence, MD;  Location: [facility] ENDOSCOPY;  Service: Endoscopy;  Laterality: N/A;    SOCIAL HISTORY: Social History   Social History  . Marital Status: Single    Spouse Name: N/A  . Number of Children: N/A  . Years of Education: N/A   Occupational History  . Not on file.   Social History Main Topics  . Smoking status: Never Smoker   . Smokeless tobacco: Never Used  . Alcohol Use: No  . Drug Use: No  . Sexual Activity: No   Other Topics Concern  . Not on file   Social History Narrative    FAMILY HISTORY: Family History  Problem Relation Age of Onset  . CAD Mother     ALLERGIES:  has No Known Allergies.  MEDICATIONS:  Current Outpatient Prescriptions  Medication Sig Dispense Refill  . ACCU-CHEK AVIVA PLUS test strip     . amLODipine (NORVASC) 5 MG tablet Take 2.5 mg by mouth every evening.     . Ascorbic Acid (VITAMIN C) 1000 MG tablet Take 1,000 mg by mouth daily.    James Cooke atorvastatin (LIPITOR) 80 MG tablet Take 80 mg by  mouth daily at 6 PM.     . Fish Oil-Cholecalciferol (OMEGA-3 + VITAMIN D3 PO) Take 1 tablet by mouth daily.    . fluticasone (FLONASE) 50 MCG/ACT nasal spray Place 2 sprays into both nostrils daily as needed for allergies or rhinitis.     James Cooke glimepiride (AMARYL) 2 MG tablet Take 2 mg by mouth daily with breakfast.    . metFORMIN (GLUCOPHAGE) 500 MG tablet Take 500 mg by mouth every evening.     . Multiple Vitamins-Minerals (ICAPS) CAPS Take 1 capsule by mouth daily.    . vitamin B-12 (CYANOCOBALAMIN) 500 MCG tablet Take 500 mcg by mouth daily.    James Cooke docusate sodium (COLACE) 100 MG capsule Take 2 capsules (200 mg  total) by mouth at bedtime as needed for mild constipation or moderate constipation. 60 capsule 0   No current facility-administered medications for this visit.   Facility-Administered Medications Ordered in Other Visits  Medication Dose Route Frequency Provider Last Rate Last Dose  . 0.9 %  sodium chloride infusion   Intravenous Once [name], MD      . acetaminophen (TYLENOL) tablet 650 mg  650 mg Oral Once [name], MD      . diphenhydrAMINE (BENADRYL) capsule 25 mg  25 mg Oral Once [name], MD        REVIEW OF SYSTEMS:    10 Point review of Systems was done is negative except as noted above.  PHYSICAL EXAMINATION: ECOG PERFORMANCE STATUS: 2 - Symptomatic, <50% confined to bed  . Filed Vitals:   12/08/15 1002  Height: 5\' 10"  (1.778 m)  Weight: 245 lb (111.131 kg)   Filed Weights   12/08/15 1002  Weight: 245 lb (111.131 kg)   .Body mass index is 35.15 kg/(m^2).  GENERAL:alert, in no acute distress and comfortable SKIN: skin color, texture, turgor are normal, no rashes or significant lesions EYES: normal, conjunctiva are pink and non-injected, sclera clear OROPHARYNX:no exudate, no erythema and lips, buccal mucosa, and tongue normal  NECK: supple, no JVD, thyroid normal size, non-tender, without nodularity LYMPH:  no palpable lymphadenopathy in the cervical, axillary or inguinal LUNGS: clear to auscultation with normal respiratory effort HEART: regular rate & rhythm,  no murmurs and no lower extremity edema ABDOMEN: abdomen soft, non-tender, normoactive bowel sounds , no hepatosplenomegaly  Musculoskeletal: no cyanosis of digits and no clubbing  PSYCH: alert & oriented x 3 with fluent speech NEURO: no focal motor/sensory deficits  LABORATORY DATA:  I have reviewed the data as listed  . CBC Latest Ref Rng 12/08/2015 10/07/2015 09/03/2015  WBC 4.0 - 10.3 10e3/uL 6.2 5.1 5.5  Hemoglobin 13.0 - 17.1 g/dL 7.3(L) 8.0(L) 9.6(L)  Hematocrit 38.4 - 49.9 % 26.0(L) 27.7(L)  32.1(L)  Platelets 140 - 400 10e3/uL 330 280 375   . CBC    Component Value Date/Time   WBC 6.2 12/08/2015 0929   WBC 7.8 12/03/2008 1505   RBC 3.69* 12/08/2015 0929   RBC 4.57 12/03/2008 1505   HGB 7.3* 12/08/2015 0929   HGB 8.3* 12/20/2014 0930   HCT 26.0* 12/08/2015 0929   HCT 28.2* 12/20/2014 0930   HCT 18.7* 12/19/2014 1830   PLT 330 12/08/2015 0929   PLT 464* 12/03/2008 1505   MCV 70.5* 12/08/2015 0929   MCV 69.5* 12/03/2008 1505   MCH 19.8* 12/08/2015 0929   MCHC 28.1* 12/08/2015 0929   MCHC 30.5 12/03/2008 1505   RDW 22.7* 12/08/2015 0929   RDW 25.9* 12/03/2008 1505   LYMPHSABS 0.6*  12/08/2015 0929   MONOABS 0.7 12/08/2015 0929   EOSABS 0.5 12/08/2015 0929   BASOSABS 0.1 12/08/2015 0929     . CMP Latest Ref Rng 12/08/2015 08/12/2015 12/19/2014  Glucose 70 - 140 mg/dl 161(H) 122 117(H)  BUN 7.0 - 26.0 mg/dL 12.4 19.1 23  Creatinine 0.7 - 1.3 mg/dL 1.2 1.1 1.06  Sodium 136 - 145 mEq/L 140 142 139  Potassium 3.5 - 5.1 mEq/L 4.4 4.5 4.5  Chloride 96 - 112 mmol/L - - 109  CO2 22 - 29 mEq/L 25 25 22   Calcium 8.4 - 10.4 mg/dL 8.9 9.1 8.7  Total Protein 6.4 - 8.3 g/dL 7.3 7.0 6.6  Total Bilirubin 0.20 - 1.20 mg/dL 0.49 0.39 0.7  Alkaline Phos 40 - 150 U/L 91 77 66  AST 5 - 34 U/L 15 15 20   ALT 0 - 55 U/L 18 17 23    . Lab Results  Component Value Date   IRON <11* 10/07/2015   TIBC 440* 10/07/2015   IRONPCTSAT Not calculated due to Iron < linear range. 10/07/2015   (Iron and TIBC)  Lab Results  Component Value Date   FERRITIN 12* 10/07/2015    B12 level: 944  RADIOGRAPHIC STUDIES: I have personally reviewed the radiological images as listed and agreed with the findings in the report. No results found.  ASSESSMENT & PLAN:   75 year old with   #1 Microcytic hypochromic anemia. Iron deficiency anemia recurrent and likely due to ongoing GI blood losses. Fecal occult blood x 2 was positive on 10/31/2015. The source of GI bleeding has been unclear. Patient  had a capsule endoscopy on 11/10/2015 did not show overt bleeding or lesion. Last blood transfusion on 11/15/2015. Has received about 5 units of PRBCs last year. When initially presented in his hemoglobin was 6.8 and responded well to IV iron and had improved to 9.6 with IV feraheme but then droppped back down gradually. Hemoglobin today is 7.3 with a decreasing MCV now down to 70. Ferritin levels from today are pending are expected to be low. We had offered him additional IV iron in early December but he wanted to postpone until his GI workup was completed but is now agreeable to proceeding with this and wants to have that done as soon as possible.  Plan -Patient relatively symptomatic at this time and there is no acute indication for PRBC transfusion at this time but would transfuse him if he were to get symptomatic or his hemoglobin dropped to below 7. -We'll schedule him for IV Feraheme every weekly 3 doses starting ASAP. -CBC every 2 weeks. -We will get platelet function assay, PT and PTT on next labs to rule out any overt bleeding disorder though this seems less likely. -Patient  reports that he is to follow-up with Dr. Paulita Fujita for an EGD on 12/10/2015 to check out a hiatal hernia. -absolutely avoid NSAIDs and other blood thinners. -If his GI workup is unrevealing and he is noted to have ongoing GI losses might consider the use of anti-fibrinolytic's to try to curve his GI blood loss. -Continued close follow-up with primary care physician .  RTC with Dr Irene Limbo in 1 months with cbc, cmp and ferritin,iron profile. Follow-up for IV Feraheme and labs as per orders.   All of the patients questions were answered to his apparent satisfaction. The patient knows to call the clinic with any problems, questions or concerns.  I spent 20 minutes counseling the patient face to face. The total time spent in the  appointment was 25 minutes and more than 50% was on counseling and direct patient cares.      Sullivan Lone MD [place] AAHIVMS [facility] Hematology/Oncology Physician [facility]  (Office):       [phone] (Work cell):  [phone] (Fax):           [phone]  12/08/2015 11:33 AM

## 2015-12-08 NOTE — Telephone Encounter (Signed)
Talked to patient here in office. Scheduled appt.       [facility]. °

## 2015-12-08 NOTE — Telephone Encounter (Signed)
Iv iron added per pof and per pof pt is aware

## 2015-12-09 ENCOUNTER — Other Ambulatory Visit: Payer: Self-pay | Admitting: Gastroenterology

## 2015-12-09 ENCOUNTER — Ambulatory Visit (HOSPITAL_BASED_OUTPATIENT_CLINIC_OR_DEPARTMENT_OTHER): Payer: Medicare Other

## 2015-12-09 VITALS — BP 148/65 | HR 89 | Temp 97.8°F | Resp 18

## 2015-12-09 DIAGNOSIS — D500 Iron deficiency anemia secondary to blood loss (chronic): Secondary | ICD-10-CM

## 2015-12-09 MED ORDER — SODIUM CHLORIDE 0.9 % IV SOLN
510.0000 mg | Freq: Once | INTRAVENOUS | Status: AC
  Administered 2015-12-09: 510 mg via INTRAVENOUS
  Filled 2015-12-09: qty 17

## 2015-12-09 MED ORDER — SODIUM CHLORIDE 0.9 % IV SOLN
Freq: Once | INTRAVENOUS | Status: AC
  Administered 2015-12-09: 16:00:00 via INTRAVENOUS

## 2015-12-09 NOTE — Patient Instructions (Signed)

## 2015-12-09 NOTE — Addendum Note (Signed)
Addended by: Tawona Filsinger on: 12/09/2015 12:38 PM   Modules accepted: Orders  

## 2015-12-10 ENCOUNTER — Ambulatory Visit (HOSPITAL_COMMUNITY)
Admission: RE | Admit: 2015-12-10 | Discharge: 2015-12-10 | Disposition: A | Discharge status: Home / Self Care | Payer: Medicare Other | Source: Ambulatory Visit | Attending: Gastroenterology | Admitting: Gastroenterology

## 2015-12-10 ENCOUNTER — Ambulatory Visit (HOSPITAL_COMMUNITY): Payer: Medicare Other | Admitting: Anesthesiology

## 2015-12-10 ENCOUNTER — Encounter (HOSPITAL_COMMUNITY)
Admission: RE | Disposition: A | Discharge status: Home / Self Care | Payer: Self-pay | Source: Ambulatory Visit | Attending: Gastroenterology

## 2015-12-10 ENCOUNTER — Encounter (HOSPITAL_COMMUNITY): Payer: Self-pay | Admitting: *Deleted

## 2015-12-10 ENCOUNTER — Other Ambulatory Visit (HOSPITAL_COMMUNITY): Payer: Self-pay | Admitting: *Deleted

## 2015-12-10 DIAGNOSIS — E785 Hyperlipidemia, unspecified: Secondary | ICD-10-CM | POA: Diagnosis not present

## 2015-12-10 DIAGNOSIS — Z79899 Other long term (current) drug therapy: Secondary | ICD-10-CM | POA: Diagnosis not present

## 2015-12-10 DIAGNOSIS — K259 Gastric ulcer, unspecified as acute or chronic, without hemorrhage or perforation: Secondary | ICD-10-CM | POA: Diagnosis not present

## 2015-12-10 DIAGNOSIS — I10 Essential (primary) hypertension: Secondary | ICD-10-CM | POA: Diagnosis not present

## 2015-12-10 DIAGNOSIS — K2970 Gastritis, unspecified, without bleeding: Secondary | ICD-10-CM | POA: Diagnosis not present

## 2015-12-10 DIAGNOSIS — E119 Type 2 diabetes mellitus without complications: Secondary | ICD-10-CM | POA: Insufficient documentation

## 2015-12-10 DIAGNOSIS — K449 Diaphragmatic hernia without obstruction or gangrene: Secondary | ICD-10-CM | POA: Diagnosis not present

## 2015-12-10 DIAGNOSIS — K253 Acute gastric ulcer without hemorrhage or perforation: Secondary | ICD-10-CM | POA: Diagnosis not present

## 2015-12-10 DIAGNOSIS — Z7984 Long term (current) use of oral hypoglycemic drugs: Secondary | ICD-10-CM | POA: Insufficient documentation

## 2015-12-10 DIAGNOSIS — D649 Anemia, unspecified: Secondary | ICD-10-CM | POA: Diagnosis not present

## 2015-12-10 DIAGNOSIS — K2900 Acute gastritis without bleeding: Secondary | ICD-10-CM | POA: Diagnosis not present

## 2015-12-10 DIAGNOSIS — D509 Iron deficiency anemia, unspecified: Secondary | ICD-10-CM | POA: Diagnosis not present

## 2015-12-10 HISTORY — DX: Type 2 diabetes mellitus without complications: E11.9

## 2015-12-10 HISTORY — PX: ESOPHAGOGASTRODUODENOSCOPY (EGD) WITH PROPOFOL: SHX5813

## 2015-12-10 LAB — PREPARE RBC (CROSSMATCH)

## 2015-12-10 LAB — HEMOGLOBIN AND HEMATOCRIT, BLOOD
HCT: 24 % — ABNORMAL LOW (ref 39.0–52.0)
Hemoglobin: 6.8 g/dL — CL (ref 13.0–17.0)

## 2015-12-10 LAB — GLUCOSE, CAPILLARY: GLUCOSE-CAPILLARY: 115 mg/dL — AB (ref 65–99)

## 2015-12-10 SURGERY — ESOPHAGOGASTRODUODENOSCOPY (EGD) WITH PROPOFOL
Anesthesia: Monitor Anesthesia Care

## 2015-12-10 MED ORDER — PROPOFOL 10 MG/ML IV BOLUS
INTRAVENOUS | Status: AC
  Filled 2015-12-10: qty 40

## 2015-12-10 MED ORDER — PROPOFOL 10 MG/ML IV BOLUS
INTRAVENOUS | Status: AC
  Filled 2015-12-10: qty 20

## 2015-12-10 MED ORDER — SODIUM CHLORIDE 0.9 % IV SOLN: Freq: Once | INTRAVENOUS | Status: DC

## 2015-12-10 MED ORDER — PHENYLEPHRINE 40 MCG/ML (10ML) SYRINGE FOR IV PUSH (FOR BLOOD PRESSURE SUPPORT)
PREFILLED_SYRINGE | INTRAVENOUS | Status: AC
  Filled 2015-12-10: qty 30

## 2015-12-10 MED ORDER — LACTATED RINGERS IV SOLN
INTRAVENOUS | Status: DC | PRN
  Administered 2015-12-10: 13:00:00 via INTRAVENOUS

## 2015-12-10 MED ORDER — PROPOFOL 500 MG/50ML IV EMUL
INTRAVENOUS | Status: DC | PRN
  Administered 2015-12-10: 150 ug/kg/min via INTRAVENOUS

## 2015-12-10 MED ORDER — PROPOFOL 10 MG/ML IV BOLUS
INTRAVENOUS | Status: DC | PRN
  Administered 2015-12-10: 70 mg via INTRAVENOUS

## 2015-12-10 MED ORDER — PHENYLEPHRINE 40 MCG/ML (10ML) SYRINGE FOR IV PUSH (FOR BLOOD PRESSURE SUPPORT)
PREFILLED_SYRINGE | INTRAVENOUS | Status: AC
  Filled 2015-12-10: qty 10

## 2015-12-10 MED ORDER — LIDOCAINE HCL (CARDIAC) 20 MG/ML IV SOLN
INTRAVENOUS | Status: DC | PRN
  Administered 2015-12-10: 60 mg via INTRAVENOUS

## 2015-12-10 SURGICAL SUPPLY — 15 items

## 2015-12-10 NOTE — Interval H&P Note (Signed)
History and Physical Interval Note:  12/10/2015 12:49 PM  James Cooke  has presented today for surgery, with the diagnosis of anemia  The various methods of treatment have been discussed with the patient and family. After consideration of risks, benefits and other options for treatment, the patient has consented to  Procedure(s): ESOPHAGOGASTRODUODENOSCOPY (EGD) WITH PROPOFOL (N/A) as a surgical intervention .  The patient's history has been reviewed, patient examined, no change in status, stable for surgery.  I have reviewed the patient's chart and labs.  Questions were answered to the patient's satisfaction.     [place] C.

## 2015-12-10 NOTE — Op Note (Signed)
[facility] [place], [id number]   ENDOSCOPY PROCEDURE REPORT  PATIENT: James Cooke, James Cooke  MR#: [id number] BIRTHDATE: [birth year] , 74  yrs. old GENDER: male ENDOSCOPIST: Wilford Corner, MD REFERRED BY:  [name], M.D. PROCEDURE DATE:  12/10/2015 PROCEDURE:  EGD w/ biopsy ASA CLASS:     Class III INDICATIONS:  iron deficiency anemia. MEDICATIONS: Monitored anesthesia care and Per Anesthesia TOPICAL ANESTHETIC: none  DESCRIPTION OF PROCEDURE: After the risks benefits and alternatives of the procedure were thoroughly explained, informed consent was obtained.  The Pentax Gastroscope [id number] endoscope was introduced through the mouth and advanced to the second portion of the duodenum , Without limitations.  The instrument was slowly withdrawn as the mucosa was fully examined. Estimated blood loss is zero unless otherwise noted in this procedure report.    Esophagus normal. GEJ normal and 36 cm from the incisors. Large hiatal hernia with 2 superficial linear ulcers noted in the hiatal hernia sac. No active bleeding seen. Minimal erythema in distal stomach c/w antral gastritis. Duodenal bulb and 2nd portion of the duodenum normal in appearance. Duodenal biopsies taken to check for celiac sprue.       Retroflexed views revealed linear ulcers noted (see above).     The scope was then withdrawn from the patient and the procedure completed.  COMPLICATIONS: There were no immediate complications.  ENDOSCOPIC IMPRESSION:     Proximal ulcers in hiatal hernia sac (no active bleeding/bleeding stigmata) Large Hiatal Hernia Minimal antral gastritis Ulcers and large hiatal hernia likely source of anemia  RECOMMENDATIONS:     F/U on duodenal biopsies; PPI QD; Avoid NSAIDs   eSigned:  Wilford Corner, MD 12/10/2015 1:30 PM    [id number] Maudie Mercury, MD  CPT CODES: ICD CODES:  The ICD and CPT codes recommended by this software are interpretations from the data  that the clinical staff has captured with the software.  The verification of the translation of this report to the ICD and CPT codes and modifiers is the sole responsibility of the health care institution and practicing physician where this report was generated.  [place]. will not be held responsible for the validity of the ICD and CPT codes included on this report.  AMA assumes no liability for data contained or not contained herein. CPT is a Designer, television/film set of the [employer].  PATIENT NAME:  James Cooke, James Cooke MR#: [id number]

## 2015-12-10 NOTE — Transfer of Care (Signed)
Immediate Anesthesia Transfer of Care Note  Patient: James Cooke  Procedure(s) Performed: Procedure(s): ESOPHAGOGASTRODUODENOSCOPY (EGD) WITH PROPOFOL (N/A)  Patient Location: PACU  Anesthesia Type:MAC  Level of Consciousness:  sedated, patient cooperative and responds to stimulation  Airway & Oxygen Therapy:Patient Spontanous Breathing and Patient connected to face mask oxgen  Post-op Assessment:  Report given to PACU RN and Post -op Vital signs reviewed and stable  Post vital signs:  Reviewed and stable  Last Vitals:  Filed Vitals:   12/10/15 1245 12/10/15 1324  BP: 184/67 147/69  Pulse: 82 89  Temp: 36.9 C 36.7 C  Resp: 13 20    Complications: No apparent anesthesia complications

## 2015-12-10 NOTE — Anesthesia Postprocedure Evaluation (Signed)
Anesthesia Post Note  Patient: James Cooke  Procedure(s) Performed: Procedure(s) (LRB): ESOPHAGOGASTRODUODENOSCOPY (EGD) WITH PROPOFOL (N/A)  Patient location during evaluation: PACU Anesthesia Type: MAC Level of consciousness: awake and alert Pain management: pain level controlled Vital Signs Assessment: post-procedure vital signs reviewed and stable Respiratory status: spontaneous breathing Cardiovascular status: blood pressure returned to baseline Anesthetic complications: no    Last Vitals:  Filed Vitals:   12/10/15 1340 12/10/15 1350  BP: 135/87 177/82  Pulse: 82 81  Temp:    Resp: 18 15    Last Pain: There were no vitals filed for this visit.               Tiajuana Amass

## 2015-12-10 NOTE — H&P (Signed)
  Date of Initial H&P: 12/08/15  History reviewed, patient examined, no change in status, stable for surgery.

## 2015-12-10 NOTE — Anesthesia Preprocedure Evaluation (Addendum)
Anesthesia Evaluation  Patient identified by MRN, date of birth, ID band Patient awake    Reviewed: Allergy & Precautions, NPO status , Patient's Chart, lab work & pertinent test results  Airway Mallampati: II  TM Distance: >3 FB Neck ROM: Full    Dental   Pulmonary neg pulmonary ROS,    breath sounds clear to auscultation       Cardiovascular hypertension, Pt. on medications  Rhythm:Regular Rate:Normal     Neuro/Psych negative neurological ROS     GI/Hepatic Neg liver ROS,   Endo/Other  diabetes, Type 2, Oral Hypoglycemic Agents  Renal/GU negative Renal ROS     Musculoskeletal   Abdominal   Peds  Hematology  (+) anemia , Hgb 6.8. Will transfuse 1u PRBC's   Anesthesia Other Findings   Reproductive/Obstetrics                            Lab Results  Component Value Date   WBC 6.2 12/08/2015   HGB 7.3* 12/08/2015   HCT 26.0* 12/08/2015   MCV 70.5* 12/08/2015   PLT 330 12/08/2015   Lab Results  Component Value Date   CREATININE 1.2 12/08/2015   BUN 12.4 12/08/2015   NA 140 12/08/2015   K 4.4 12/08/2015   CL 109 12/19/2014   CO2 25 12/08/2015    Anesthesia Physical Anesthesia Plan  ASA: III  Anesthesia Plan: MAC   Post-op Pain Management:    Induction: Intravenous  Airway Management Planned: Nasal Cannula and Natural Airway  Additional Equipment:   Intra-op Plan:   Post-operative Plan:   Informed Consent: I have reviewed the patients History and Physical, chart, labs and discussed the procedure including the risks, benefits and alternatives for the proposed anesthesia with the patient or authorized representative who has indicated his/her understanding and acceptance.     Plan Discussed with: CRNA  Anesthesia Plan Comments:         Anesthesia Quick Evaluation

## 2015-12-10 NOTE — Progress Notes (Signed)
Critical Hgb of 6.8 called. Will report to Physician. Richardean Sale, RN

## 2015-12-10 NOTE — Discharge Instructions (Signed)
Gastrointestinal Endoscopy, Care After °Refer to this sheet in the next few weeks. These instructions provide you with information on caring for yourself after your procedure. Your caregiver may also give you more specific instructions. Your treatment has been planned according to current medical practices, but problems sometimes occur. Call your caregiver if you have any problems or questions after your procedure. °HOME CARE INSTRUCTIONS °· If you were given medicine to help you relax (sedative), do not drive, operate machinery, or sign important documents for 24 hours. °· Avoid alcohol and hot or warm beverages for the first 24 hours after the procedure. °· Only take over-the-counter or prescription medicines for pain, discomfort, or fever as directed by your caregiver. You may resume taking your normal medicines unless your caregiver tells you otherwise. Ask your caregiver when you may resume taking medicines that may cause bleeding, such as aspirin, clopidogrel, or warfarin. °· You may return to your normal diet and activities on the day after your procedure, or as directed by your caregiver. Walking may help to reduce any bloated feeling in your abdomen. °· Drink enough fluids to keep your urine clear or pale yellow. °· You may gargle with salt water if you have a sore throat. °SEEK IMMEDIATE MEDICAL CARE IF: °· You have severe nausea or vomiting. °· You have severe abdominal pain, abdominal cramps that last longer than 6 hours, or abdominal swelling (distention). °· You have severe shoulder or back pain. °· You have trouble swallowing. °· You have shortness of breath, your breathing is shallow, or you are breathing faster than normal. °· You have a fever or a rapid heartbeat. °· You vomit blood or material that looks like coffee grounds. °· You have bloody, black, or tarry stools. °MAKE SURE YOU: °· Understand these instructions. °· Will watch your condition. °· Will get help right away if you are not doing  well or get worse. °  °This information is not intended to replace advice given to you by your health care provider. Make sure you discuss any questions you have with your health care provider. °  °Document Released: 06/22/2004 Document Revised: 11/29/2014 Document Reviewed: 02/08/2012 °Elsevier Interactive Patient Education ©2016 [employer] ° °

## 2015-12-11 ENCOUNTER — Ambulatory Visit (HOSPITAL_COMMUNITY)
Admission: RE | Admit: 2015-12-11 | Discharge: 2015-12-11 | Disposition: A | Discharge status: Home / Self Care | Payer: Medicare Other | Source: Ambulatory Visit | Attending: Internal Medicine | Admitting: Internal Medicine

## 2015-12-11 ENCOUNTER — Encounter (HOSPITAL_COMMUNITY): Payer: Self-pay | Admitting: Gastroenterology

## 2015-12-11 DIAGNOSIS — D649 Anemia, unspecified: Secondary | ICD-10-CM | POA: Diagnosis not present

## 2015-12-11 LAB — TYPE AND SCREEN
ABO/RH(D): B POS
ANTIBODY SCREEN: POSITIVE
DAT, IgG: NEGATIVE
DONOR AG TYPE: NEGATIVE
Unit division: 0

## 2015-12-11 LAB — CBC
HCT: 29 % — ABNORMAL LOW (ref 39.0–52.0)
HEMOGLOBIN: 8.1 g/dL — AB (ref 13.0–17.0)
MCH: 20.2 pg — ABNORMAL LOW (ref 26.0–34.0)
MCHC: 27.9 g/dL — ABNORMAL LOW (ref 30.0–36.0)
MCV: 72.3 fL — ABNORMAL LOW (ref 78.0–100.0)
PLATELETS: 321 10*3/uL (ref 150–400)
RBC: 4.01 MIL/uL — AB (ref 4.22–5.81)
RDW: 23.5 % — ABNORMAL HIGH (ref 11.5–15.5)
WBC: 7.3 10*3/uL (ref 4.0–10.5)

## 2015-12-11 LAB — PREPARE RBC (CROSSMATCH)

## 2015-12-11 MED ORDER — SODIUM CHLORIDE 0.9 % IV SOLN: Freq: Once | INTRAVENOUS | Status: DC

## 2015-12-12 LAB — TYPE AND SCREEN
ABO/RH(D): B POS
ANTIBODY SCREEN: POSITIVE
DAT, IgG: NEGATIVE
DONOR AG TYPE: NEGATIVE
Unit division: 0

## 2015-12-15 DIAGNOSIS — K449 Diaphragmatic hernia without obstruction or gangrene: Secondary | ICD-10-CM | POA: Diagnosis not present

## 2015-12-15 DIAGNOSIS — D509 Iron deficiency anemia, unspecified: Secondary | ICD-10-CM | POA: Diagnosis not present

## 2015-12-16 ENCOUNTER — Other Ambulatory Visit: Payer: Self-pay | Admitting: Hematology

## 2015-12-16 ENCOUNTER — Ambulatory Visit (HOSPITAL_BASED_OUTPATIENT_CLINIC_OR_DEPARTMENT_OTHER): Payer: Medicare Other

## 2015-12-16 VITALS — BP 146/57 | HR 73 | Temp 98.6°F | Resp 24

## 2015-12-16 DIAGNOSIS — D500 Iron deficiency anemia secondary to blood loss (chronic): Secondary | ICD-10-CM | POA: Diagnosis not present

## 2015-12-16 MED ORDER — SODIUM CHLORIDE 0.9 % IV SOLN
Freq: Once | INTRAVENOUS | Status: AC
  Administered 2015-12-16: 15:00:00 via INTRAVENOUS

## 2015-12-16 MED ORDER — SODIUM CHLORIDE 0.9 % IV SOLN
510.0000 mg | Freq: Once | INTRAVENOUS | Status: AC
  Administered 2015-12-16: 510 mg via INTRAVENOUS
  Filled 2015-12-16: qty 17

## 2015-12-25 DIAGNOSIS — D500 Iron deficiency anemia secondary to blood loss (chronic): Secondary | ICD-10-CM | POA: Diagnosis not present

## 2015-12-25 DIAGNOSIS — K257 Chronic gastric ulcer without hemorrhage or perforation: Secondary | ICD-10-CM | POA: Diagnosis not present

## 2015-12-25 DIAGNOSIS — K449 Diaphragmatic hernia without obstruction or gangrene: Secondary | ICD-10-CM | POA: Diagnosis not present

## 2016-01-01 DIAGNOSIS — I10 Essential (primary) hypertension: Secondary | ICD-10-CM | POA: Diagnosis not present

## 2016-01-01 DIAGNOSIS — E119 Type 2 diabetes mellitus without complications: Secondary | ICD-10-CM | POA: Diagnosis not present

## 2016-01-06 ENCOUNTER — Other Ambulatory Visit: Payer: Self-pay

## 2016-01-06 ENCOUNTER — Ambulatory Visit: Payer: Self-pay | Admitting: Hematology

## 2016-01-07 DIAGNOSIS — I10 Essential (primary) hypertension: Secondary | ICD-10-CM | POA: Diagnosis not present

## 2016-01-07 DIAGNOSIS — D509 Iron deficiency anemia, unspecified: Secondary | ICD-10-CM | POA: Diagnosis not present

## 2016-01-07 DIAGNOSIS — D649 Anemia, unspecified: Secondary | ICD-10-CM | POA: Diagnosis not present

## 2016-01-07 DIAGNOSIS — E7800 Pure hypercholesterolemia, unspecified: Secondary | ICD-10-CM | POA: Diagnosis not present

## 2016-01-15 ENCOUNTER — Other Ambulatory Visit: Payer: Self-pay

## 2016-01-15 ENCOUNTER — Ambulatory Visit: Payer: Self-pay | Admitting: Hematology

## 2016-01-16 ENCOUNTER — Telehealth: Payer: Self-pay | Admitting: Hematology

## 2016-01-16 ENCOUNTER — Other Ambulatory Visit: Payer: Self-pay | Admitting: *Deleted

## 2016-01-16 NOTE — Telephone Encounter (Signed)
S/w pt confirming labs/ov per 02/24 POF... KJ

## 2016-01-26 ENCOUNTER — Ambulatory Visit: Payer: Self-pay | Admitting: Hematology

## 2016-01-26 ENCOUNTER — Other Ambulatory Visit: Payer: Self-pay

## 2016-01-27 ENCOUNTER — Other Ambulatory Visit: Payer: Self-pay | Admitting: *Deleted

## 2016-01-28 ENCOUNTER — Telehealth: Payer: Self-pay | Admitting: Hematology

## 2016-01-28 ENCOUNTER — Ambulatory Visit (HOSPITAL_BASED_OUTPATIENT_CLINIC_OR_DEPARTMENT_OTHER): Payer: Medicare Other | Admitting: *Deleted

## 2016-01-28 ENCOUNTER — Encounter: Payer: Self-pay | Admitting: Hematology

## 2016-01-28 ENCOUNTER — Ambulatory Visit (HOSPITAL_BASED_OUTPATIENT_CLINIC_OR_DEPARTMENT_OTHER): Payer: Medicare Other | Admitting: Hematology

## 2016-01-28 ENCOUNTER — Other Ambulatory Visit (HOSPITAL_COMMUNITY)
Admission: RE | Admit: 2016-01-28 | Discharge: 2016-01-28 | Disposition: A | Discharge status: Home / Self Care | Payer: Medicare Other | Source: Other Acute Inpatient Hospital | Attending: Hematology | Admitting: Hematology

## 2016-01-28 VITALS — BP 148/61 | HR 85 | Temp 98.0°F | Resp 18 | Ht 70.0 in | Wt 247.6 lb

## 2016-01-28 DIAGNOSIS — D500 Iron deficiency anemia secondary to blood loss (chronic): Secondary | ICD-10-CM

## 2016-01-28 DIAGNOSIS — D509 Iron deficiency anemia, unspecified: Secondary | ICD-10-CM | POA: Diagnosis not present

## 2016-01-28 DIAGNOSIS — K922 Gastrointestinal hemorrhage, unspecified: Secondary | ICD-10-CM | POA: Diagnosis not present

## 2016-01-28 LAB — CBC & DIFF AND RETIC
BASO%: 0.5 % (ref 0.0–2.0)
BASOS ABS: 0 10*3/uL (ref 0.0–0.1)
EOS ABS: 0.4 10*3/uL (ref 0.0–0.5)
EOS%: 5 % (ref 0.0–7.0)
HEMATOCRIT: 31.7 % — AB (ref 38.4–49.9)
HEMOGLOBIN: 9.8 g/dL — AB (ref 13.0–17.1)
Immature Retic Fract: 18 % — ABNORMAL HIGH (ref 3.00–10.60)
LYMPH%: 14.1 % (ref 14.0–49.0)
MCH: 25.1 pg — AB (ref 27.2–33.4)
MCHC: 30.9 g/dL — ABNORMAL LOW (ref 32.0–36.0)
MCV: 81.3 fL (ref 79.3–98.0)
MONO#: 0.6 10*3/uL (ref 0.1–0.9)
MONO%: 7.6 % (ref 0.0–14.0)
NEUT#: 5.9 10*3/uL (ref 1.5–6.5)
NEUT%: 72.8 % (ref 39.0–75.0)
PLATELETS: 300 10*3/uL (ref 140–400)
RBC: 3.9 10*6/uL — ABNORMAL LOW (ref 4.20–5.82)
RDW: 22.6 % — ABNORMAL HIGH (ref 11.0–14.6)
Retic %: 2.3 % — ABNORMAL HIGH (ref 0.80–1.80)
Retic Ct Abs: 89.7 10*3/uL (ref 34.80–93.90)
WBC: 8.2 10*3/uL (ref 4.0–10.3)
lymph#: 1.2 10*3/uL (ref 0.9–3.3)

## 2016-01-28 LAB — IRON AND TIBC
%SAT: 5 % — AB (ref 20–55)
Iron: 21 ug/dL — ABNORMAL LOW (ref 42–163)
TIBC: 406 ug/dL (ref 202–409)
UIBC: 386 ug/dL — ABNORMAL HIGH (ref 117–376)

## 2016-01-28 LAB — PLATELET FUNCTION ASSAY: COLLAGEN / EPINEPHRINE: 125 s (ref 0–193)

## 2016-01-28 LAB — FERRITIN: Ferritin: 18 ng/ml — ABNORMAL LOW (ref 22–316)

## 2016-01-28 MED ORDER — B COMPLEX VITAMINS PO CAPS: 1.0000 | ORAL_CAPSULE | Freq: Every day | ORAL | Status: DC

## 2016-01-28 NOTE — Addendum Note (Signed)
Addended by: Kellie Simmering A on: 01/28/2016 10:54 AM   Modules accepted: Medications

## 2016-01-28 NOTE — Telephone Encounter (Signed)
Gave and printed appt sched and avs for pt for April °

## 2016-01-28 NOTE — Progress Notes (Signed)
James Cooke    HEMATOLOGY/ONCOLOGY CLINIC NOTE  Date of Service:. 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 Paulita Fujita showed benign polyps. He was recommended repeat colonoscopy in 3-5 years. EGD done 04/18/1215 showed no overt bleeding issues.  Current Treatment IV feraheme as needed to maintain ferritin >100 B complex 1 cap po daily  HISTORY OF PRESENTING ILLNESS:  (Plz see initial consultation for details regarding initial presentation)  INTERVAL HISTORY  James Cooke is here for f/u regarding his iron deficiency anemia. He notes no overt melena or hematochezia. Has been feeling well and has had good energy levels. His hemoglobin is up to 9.8 which is much better than his recent levels. Had an EGD in January 2017 by Dr. Paulita Fujita that suggested that his hiatal hernia with Lysbeth Galas ulcers was likely cause of his chronic blood loss. He was referred to surgeon for discussion of possible surgery to reduce his risk of recurrent blood loss but he refused this option currently. He wants to follow up conservatively and try to maintain iron levels with IV iron infusions. He is also on a PPI. No other acute new symptoms currently.  MEDICAL HISTORY:  Past Medical History  Diagnosis Date  . Hypertension   . Hyperlipidemia   . Anemia   . Meningitis   . Iron deficiency   . Macular degeneration   . Diabetes mellitus without complication ([place])     SURGICAL HISTORY: Past Surgical History  Procedure Laterality Date  . Tonsillectomy    . Appendectomy    . Hernia repair    . Esophagogastroduodenoscopy    . Colonoscopy    . Givens capsule study N/A 11/10/2015    Procedure:  GIVENS CAPSULE STUDY;  Surgeon: Arta Silence, MD;  Location: [facility] ENDOSCOPY;  Service: Endoscopy;  Laterality: N/A;  . Esophagogastroduodenoscopy (egd) with propofol N/A 12/10/2015    Procedure: ESOPHAGOGASTRODUODENOSCOPY (EGD) WITH PROPOFOL;  Surgeon: Wilford Corner, MD;  Location: [facility] ENDOSCOPY;  Service: Gastroenterology;  Laterality: N/A;    SOCIAL HISTORY: Social History   Social History  . Marital Status: Single    Spouse Name: N/A  . Number of Children: N/A  . Years of Education: N/A   Occupational History  . Not on file.   Social History Main Topics  . Smoking status: Never Smoker   . Smokeless tobacco: Never Used  . Alcohol Use: No  . Drug Use: No  . Sexual Activity: No   Other Topics Concern  . Not on file   Social History Narrative    FAMILY HISTORY: Family History  Problem Relation Age of Onset  . CAD Mother     ALLERGIES:  has No Known Allergies.  MEDICATIONS:  Current Outpatient Prescriptions  Medication Sig Dispense Refill  . ACCU-CHEK AVIVA PLUS test strip     . amLODipine (NORVASC) 5 MG tablet Take 2.5 mg by mouth every evening.     . Ascorbic Acid (VITAMIN C) 1000 MG tablet Take 1,000 mg by mouth daily.    James Cooke atorvastatin (LIPITOR) 80 MG tablet Take 80 mg by mouth daily at 6 PM.     . b complex vitamins capsule Take 1 capsule by mouth daily.    James Cooke  docusate sodium (COLACE) 100 MG capsule Take 2 capsules (200 mg total) by mouth at bedtime as needed for mild constipation or moderate constipation. 60 capsule 0  . Fish Oil-Cholecalciferol (OMEGA-3 + VITAMIN D3 PO) Take 1 tablet by mouth daily.    . fluticasone (FLONASE) 50 MCG/ACT nasal spray Place 2 sprays into both nostrils daily as needed for allergies or rhinitis.     James Cooke glimepiride (AMARYL) 2 MG tablet Take 2 mg by mouth daily with breakfast.    . metFORMIN (GLUCOPHAGE) 500 MG tablet Take 500 mg by mouth every evening.     . Multiple Vitamins-Minerals (ICAPS) CAPS Take 1 capsule by mouth daily.    .  vitamin B-12 (CYANOCOBALAMIN) 500 MCG tablet Take 500 mcg by mouth daily.     No current facility-administered medications for this visit.   Facility-Administered Medications Ordered in Other Visits  Medication Dose Route Frequency Provider Last Rate Last Dose  . 0.9 %  sodium chloride infusion   Intravenous Once [name], MD      . acetaminophen (TYLENOL) tablet 650 mg  650 mg Oral Once [name], MD      . diphenhydrAMINE (BENADRYL) capsule 25 mg  25 mg Oral Once [name], MD        REVIEW OF SYSTEMS:    10 Point review of Systems was done is negative except as noted above.  PHYSICAL EXAMINATION: ECOG PERFORMANCE STATUS: 2 - Symptomatic, <50% confined to bed  . Filed Vitals:   01/28/16 1008  Height: 5\' 10"  (1.778 m)  Weight: 247 lb 9.6 oz (112.311 kg)   Filed Weights   01/28/16 1008  Weight: 247 lb 9.6 oz (112.311 kg)   .Body mass index is 35.53 kg/(m^2).  GENERAL:alert, in no acute distress and comfortable SKIN: skin color, texture, turgor are normal, no rashes or significant lesions EYES: normal, conjunctiva are pink and non-injected, sclera clear OROPHARYNX:no exudate, no erythema and lips, buccal mucosa, and tongue normal  NECK: supple, no JVD, thyroid normal size, non-tender, without nodularity LYMPH:  no palpable lymphadenopathy in the cervical, axillary or inguinal LUNGS: clear to auscultation with normal respiratory effort HEART: regular rate & rhythm,  no murmurs and no lower extremity edema ABDOMEN: abdomen soft, non-tender, normoactive bowel sounds , no hepatosplenomegaly  Musculoskeletal: no cyanosis of digits and no clubbing  PSYCH: alert & oriented x 3 with fluent speech NEURO: no focal motor/sensory deficits  LABORATORY DATA:  I have reviewed the data as listed  . CBC Latest Ref Rng 01/28/2016 12/11/2015 12/10/2015  WBC 4.0 - 10.3 10e3/uL 8.2 7.3 -  Hemoglobin 13.0 - 17.1 g/dL 9.8(L) 8.1(L) 6.8(LL)  Hematocrit 38.4 - 49.9 % 31.7(L) 29.0(L) 24.0(L)    Platelets 140 - 400 10e3/uL 300 321 -   . CBC    Component Value Date/Time   WBC 8.2 01/28/2016 0934   WBC 7.3 12/11/2015 0831   RBC 3.90* 01/28/2016 0934   RBC 4.01* 12/11/2015 0831   HGB 9.8* 01/28/2016 0934   HGB 8.1* 12/11/2015 0831   HCT 31.7* 01/28/2016 0934   HCT 29.0* 12/11/2015 0831   HCT 18.7* 12/19/2014 1830   PLT 300 01/28/2016 0934   PLT 321 12/11/2015 0831   MCV 81.3 01/28/2016 0934   MCV 72.3* 12/11/2015 0831   MCH 25.1* 01/28/2016 0934   MCH 20.2* 12/11/2015 0831   MCHC 30.9* 01/28/2016 0934   MCHC 27.9* 12/11/2015 0831   RDW 22.6* 01/28/2016 0934   RDW 23.5* 12/11/2015 0831   LYMPHSABS  1.2 01/28/2016 0934   MONOABS 0.6 01/28/2016 0934   EOSABS 0.4 01/28/2016 0934   BASOSABS 0.0 01/28/2016 0934     . CMP Latest Ref Rng 12/08/2015 08/12/2015 12/19/2014  Glucose 70 - 140 mg/dl 161(H) 122 117(H)  BUN 7.0 - 26.0 mg/dL 12.4 19.1 23  Creatinine 0.7 - 1.3 mg/dL 1.2 1.1 1.06  Sodium 136 - 145 mEq/L 140 142 139  Potassium 3.5 - 5.1 mEq/L 4.4 4.5 4.5  Chloride 96 - 112 mmol/L - - 109  CO2 22 - 29 mEq/L 25 25 22   Calcium 8.4 - 10.4 mg/dL 8.9 9.1 8.7  Total Protein 6.4 - 8.3 g/dL 7.3 7.0 6.6  Total Bilirubin 0.20 - 1.20 mg/dL 0.49 0.39 0.7  Alkaline Phos 40 - 150 U/L 91 77 66  AST 5 - 34 U/L 15 15 20   ALT 0 - 55 U/L 18 17 23    . Lab Results  Component Value Date   IRON <11* 10/07/2015   TIBC 440* 10/07/2015   IRONPCTSAT Not calculated due to Iron < linear range. 10/07/2015   (Iron and TIBC)  Lab Results  Component Value Date   FERRITIN <4* 12/08/2015    B12 level: 944  RADIOGRAPHIC STUDIES: I have personally reviewed the radiological images as listed and agreed with the findings in the report. No results found.  ASSESSMENT & PLAN:   75 year old with   #1 Microcytic hypochromic anemia. Iron deficiency anemia recurrent and likely due to ongoing GI blood losses. Fecal occult blood x 2 was positive on 10/31/2015. The source of GI bleeding thought  to be his Ulcers related to his hiatal hernia . Patient has refused surgical option to address his hiatal hernia with ulceration causing recurrent chronic GI bleeds. Plan -Patient's hemoglobin today is stable at 9.8 is better than his other recent labs. -We are awaiting iron lab results from today. -We will plan to treat him aggressively with IV iron to maintain ferritin levels of more than 100 anticipating intermittent GI blood losses. -He was also recommended to take a B complex capsule daily to support accelerated hematopoiesis. -Have sent out  platelet function assay, PT and PTT  to rule out any overt bleeding disorder though this seems less likely. -Follow-up with primary care physician to optimize blood pressure control. -Continue PPI twice a day pre-meals. -absolutely avoid NSAIDs and other blood thinners.  -Continued close follow-up with primary care physician .  RTC with Dr Irene Limbo in 1 months with cbc, cmp and ferritin,iron profile. Follow-up for IV Feraheme and labs as per orders.   All of the patients questions were answered to his apparent satisfaction. The patient knows to call the clinic with any problems, questions or concerns.  I spent 15 minutes counseling the patient face to face. The total time spent in the appointment was 20 minutes and more than 50% was on counseling and direct patient cares.    Sullivan Lone MD [place] AAHIVMS [facility] Hematology/Oncology Physician [facility]  (Office):       [phone] (Work cell):  [phone] (Fax):           [phone]  01/28/2016 10:41 AM

## 2016-01-29 LAB — PROTHROMBIN TIME (PT)
INR: 1 (ref 0.8–1.2)
Prothrombin Time: 10.5 s (ref 9.1–12.0)

## 2016-01-29 LAB — APTT: aPTT: 26 s (ref 24–33)

## 2016-02-24 ENCOUNTER — Other Ambulatory Visit: Payer: Self-pay | Admitting: *Deleted

## 2016-02-25 ENCOUNTER — Telehealth: Payer: Self-pay | Admitting: Hematology

## 2016-02-25 ENCOUNTER — Ambulatory Visit (HOSPITAL_BASED_OUTPATIENT_CLINIC_OR_DEPARTMENT_OTHER): Payer: Medicare Other | Admitting: Hematology

## 2016-02-25 ENCOUNTER — Encounter: Payer: Self-pay | Admitting: Hematology

## 2016-02-25 ENCOUNTER — Other Ambulatory Visit (HOSPITAL_BASED_OUTPATIENT_CLINIC_OR_DEPARTMENT_OTHER): Payer: Medicare Other

## 2016-02-25 VITALS — BP 144/58 | HR 94 | Temp 98.2°F | Resp 18 | Ht 70.0 in | Wt 246.3 lb

## 2016-02-25 DIAGNOSIS — D509 Iron deficiency anemia, unspecified: Secondary | ICD-10-CM

## 2016-02-25 DIAGNOSIS — D500 Iron deficiency anemia secondary to blood loss (chronic): Secondary | ICD-10-CM | POA: Diagnosis not present

## 2016-02-25 DIAGNOSIS — K922 Gastrointestinal hemorrhage, unspecified: Secondary | ICD-10-CM

## 2016-02-25 LAB — COMPREHENSIVE METABOLIC PANEL
ALBUMIN: 3.6 g/dL (ref 3.5–5.0)
ALK PHOS: 89 U/L (ref 40–150)
ALT: 27 U/L (ref 0–55)
AST: 20 U/L (ref 5–34)
Anion Gap: 9 mEq/L (ref 3–11)
BILIRUBIN TOTAL: 0.45 mg/dL (ref 0.20–1.20)
BUN: 18.2 mg/dL (ref 7.0–26.0)
CALCIUM: 9.1 mg/dL (ref 8.4–10.4)
CO2: 25 mEq/L (ref 22–29)
CREATININE: 1 mg/dL (ref 0.7–1.3)
Chloride: 107 mEq/L (ref 98–109)
EGFR: 71 mL/min/{1.73_m2} — AB (ref >90)
Glucose: 156 mg/dl — ABNORMAL HIGH (ref 70–140)
Potassium: 4.3 mEq/L (ref 3.5–5.1)
Sodium: 141 mEq/L (ref 136–145)
TOTAL PROTEIN: 7.5 g/dL (ref 6.4–8.3)

## 2016-02-25 LAB — CBC & DIFF AND RETIC
BASO%: 0.9 % (ref 0.0–2.0)
BASOS ABS: 0.1 10*3/uL (ref 0.0–0.1)
EOS%: 4 % (ref 0.0–7.0)
Eosinophils Absolute: 0.3 10*3/uL (ref 0.0–0.5)
HEMATOCRIT: 30.7 % — AB (ref 38.4–49.9)
HGB: 9.3 g/dL — ABNORMAL LOW (ref 13.0–17.1)
Immature Retic Fract: 18 % — ABNORMAL HIGH (ref 3.00–10.60)
LYMPH#: 0.7 10*3/uL — AB (ref 0.9–3.3)
LYMPH%: 8.6 % — ABNORMAL LOW (ref 14.0–49.0)
MCH: 23.4 pg — AB (ref 27.2–33.4)
MCHC: 30.3 g/dL — AB (ref 32.0–36.0)
MCV: 77.3 fL — ABNORMAL LOW (ref 79.3–98.0)
MONO#: 0.5 10*3/uL (ref 0.1–0.9)
MONO%: 6.4 % (ref 0.0–14.0)
NEUT%: 80.1 % — ABNORMAL HIGH (ref 39.0–75.0)
NEUTROS ABS: 6.6 10*3/uL — AB (ref 1.5–6.5)
Platelets: 264 10*3/uL (ref 140–400)
RBC: 3.97 10*6/uL — AB (ref 4.20–5.82)
RDW: 20.4 % — AB (ref 11.0–14.6)
RETIC %: 1.57 % (ref 0.80–1.80)
RETIC CT ABS: 62.33 10*3/uL (ref 34.80–93.90)
WBC: 8.2 10*3/uL (ref 4.0–10.3)

## 2016-02-25 LAB — IRON AND TIBC
%SAT: 3 % — ABNORMAL LOW (ref 20–55)
IRON: 15 ug/dL — AB (ref 42–163)
TIBC: 446 ug/dL — AB (ref 202–409)
UIBC: 431 ug/dL — ABNORMAL HIGH (ref 117–376)

## 2016-02-25 LAB — FERRITIN: FERRITIN: 10 ng/mL — AB (ref 22–316)

## 2016-02-25 NOTE — Progress Notes (Signed)
All .    HEMATOLOGY/ONCOLOGY CLINIC NOTE  Date of Service:. 02/25/2016     Patient Care Team: [name], MD as PCP - General (Internal Medicine)   Gastroenterology: Dr. 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 Paulita Fujita showed benign polyps. He was recommended repeat colonoscopy in 3-5 years. EGD done 04/18/1215 showed no overt bleeding issues.  Current Treatment IV feraheme as needed to maintain ferritin >100 B complex 1 cap po daily B12 500 g daily orally  HISTORY OF PRESENTING ILLNESS:  (Plz see initial consultation for details regarding initial presentation)  INTERVAL HISTORY  Mr Duggan is here for f/u regarding his iron deficiency anemia. He notes that he has been monitoring his stools closely and does not note any overt melena or hematochezia. He notes that his energy levels have been steady. His ferritin on last visit was 18. His hemoglobin remained stable at 9.3 but he is starting to develop microcytosis suggesting that he is likely getting more iron deficient again. We discussed that he will need additional IV iron replacement and he is willing to do this. He notes that he missed an appointment with gastroenterology and was encouraged to maintain follow-up as further recommendations. No other acute new symptoms. No abnormal pain. Appears to be in good spirits.   MEDICAL HISTORY:  Past Medical History  Diagnosis Date  . Hypertension   . Hyperlipidemia   . Anemia   . Meningitis   . Iron deficiency   . Macular degeneration   . Diabetes mellitus without complication ([place])     SURGICAL HISTORY: Past Surgical History  Procedure Laterality Date  . Tonsillectomy    . Appendectomy    . Hernia repair    .  Esophagogastroduodenoscopy    . Colonoscopy    . Givens capsule study N/A 11/10/2015    Procedure: GIVENS CAPSULE STUDY;  Surgeon: Arta Silence, MD;  Location: [facility] ENDOSCOPY;  Service: Endoscopy;  Laterality: N/A;  . Esophagogastroduodenoscopy (egd) with propofol N/A 12/10/2015    Procedure: ESOPHAGOGASTRODUODENOSCOPY (EGD) WITH PROPOFOL;  Surgeon: Wilford Corner, MD;  Location: [facility] ENDOSCOPY;  Service: Gastroenterology;  Laterality: N/A;    SOCIAL HISTORY: Social History   Social History  . Marital Status: Single    Spouse Name: N/A  . Number of Children: N/A  . Years of Education: N/A   Occupational History  . Not on file.   Social History Main Topics  . Smoking status: Never Smoker   . Smokeless tobacco: Never Used  . Alcohol Use: No  . Drug Use: No  . Sexual Activity: No   Other Topics Concern  . Not on file   Social History Narrative    FAMILY HISTORY: Family History  Problem Relation Age of Onset  . CAD Mother     ALLERGIES:  has No Known Allergies.  MEDICATIONS:  Current Outpatient Prescriptions  Medication Sig Dispense Refill  . ACCU-CHEK AVIVA PLUS test strip     . amLODipine (NORVASC) 5 MG tablet Take 7.5 mg by mouth every evening.     . Ascorbic Acid (VITAMIN C) 1000 MG tablet Take 1,000 mg by mouth daily.    Marland Kitchen atorvastatin (LIPITOR) 80 MG tablet Take 80 mg by mouth daily at 6 PM.     . b  complex vitamins capsule Take 1 capsule by mouth daily.    Marland Kitchen docusate sodium (COLACE) 100 MG capsule Take 2 capsules (200 mg total) by mouth at bedtime as needed for mild constipation or moderate constipation. 60 capsule 0  . Fish Oil-Cholecalciferol (OMEGA-3 + VITAMIN D3 PO) Take 1 tablet by mouth daily.    . fluticasone (FLONASE) 50 MCG/ACT nasal spray Place 2 sprays into both nostrils daily as needed for allergies or rhinitis.     Marland Kitchen glimepiride (AMARYL) 2 MG tablet Take 2 mg by mouth daily with breakfast.    . metFORMIN (GLUCOPHAGE) 500 MG tablet Take 500 mg by  mouth every evening.     . Multiple Vitamins-Minerals (ICAPS) CAPS Take 1 capsule by mouth daily.    . vitamin B-12 (CYANOCOBALAMIN) 500 MCG tablet Take 500 mcg by mouth daily.     No current facility-administered medications for this visit.   Facility-Administered Medications Ordered in Other Visits  Medication Dose Route Frequency Provider Last Rate Last Dose  . 0.9 %  sodium chloride infusion   Intravenous Once [name], MD      . acetaminophen (TYLENOL) tablet 650 mg  650 mg Oral Once [name], MD      . diphenhydrAMINE (BENADRYL) capsule 25 mg  25 mg Oral Once [name], MD        REVIEW OF SYSTEMS:    10 Point review of Systems was done is negative except as noted above.  PHYSICAL EXAMINATION: ECOG PERFORMANCE STATUS: 2 - Symptomatic, <50% confined to bed  . Filed Vitals:   02/25/16 0936  Height: 5\' 10"  (1.778 m)  Weight: 246 lb 4.8 oz (111.721 kg)   Filed Weights   02/25/16 0936  Weight: 246 lb 4.8 oz (111.721 kg)   .Body mass index is 35.34 kg/(m^2).  GENERAL:alert, in no acute distress and comfortable SKIN: skin color, texture, turgor are normal, no rashes or significant lesions EYES: normal, conjunctiva are pink and non-injected, sclera clear OROPHARYNX:no exudate, no erythema and lips, buccal mucosa, and tongue normal  NECK: supple, no JVD, thyroid normal size, non-tender, without nodularity LYMPH:  no palpable lymphadenopathy in the cervical, axillary or inguinal LUNGS: clear to auscultation with normal respiratory effort HEART: regular rate & rhythm,  no murmurs and no lower extremity edema ABDOMEN: abdomen soft, non-tender, normoactive bowel sounds , no hepatosplenomegaly  Musculoskeletal: no cyanosis of digits and no clubbing  PSYCH: alert & oriented x 3 with fluent speech NEURO: no focal motor/sensory deficits  LABORATORY DATA:  I have reviewed the data as listed  . CBC Latest Ref Rng 02/25/2016 01/28/2016 12/11/2015  WBC 4.0 - 10.3 10e3/uL 8.2 8.2 7.3    Hemoglobin 13.0 - 17.1 g/dL 9.3(L) 9.8(L) 8.1(L)  Hematocrit 38.4 - 49.9 % 30.7(L) 31.7(L) 29.0(L)  Platelets 140 - 400 10e3/uL 264 300 321   . CBC    Component Value Date/Time   WBC 8.2 02/25/2016 0920   WBC 7.3 12/11/2015 0831   RBC 3.97* 02/25/2016 0920   RBC 4.01* 12/11/2015 0831   HGB 9.3* 02/25/2016 0920   HGB 8.1* 12/11/2015 0831   HCT 30.7* 02/25/2016 0920   HCT 29.0* 12/11/2015 0831   HCT 18.7* 12/19/2014 1830   PLT 264 02/25/2016 0920   PLT 321 12/11/2015 0831   MCV 77.3* 02/25/2016 0920   MCV 72.3* 12/11/2015 0831   MCH 23.4* 02/25/2016 0920   MCH 20.2* 12/11/2015 0831   MCHC 30.3* 02/25/2016 0920   MCHC 27.9* 12/11/2015 0831  RDW 20.4* 02/25/2016 0920   RDW 23.5* 12/11/2015 0831   LYMPHSABS 0.7* 02/25/2016 0920   MONOABS 0.5 02/25/2016 0920   EOSABS 0.3 02/25/2016 0920   BASOSABS 0.1 02/25/2016 0920     . CMP Latest Ref Rng 02/25/2016 12/08/2015 08/12/2015  Glucose 70 - 140 mg/dl 156(H) 161(H) 122  BUN 7.0 - 26.0 mg/dL 18.2 12.4 19.1  Creatinine 0.7 - 1.3 mg/dL 1.0 1.2 1.1  Sodium 136 - 145 mEq/L 141 140 142  Potassium 3.5 - 5.1 mEq/L 4.3 4.4 4.5  Chloride 96 - 112 mmol/L - - -  CO2 22 - 29 mEq/L 25 25 25   Calcium 8.4 - 10.4 mg/dL 9.1 8.9 9.1  Total Protein 6.4 - 8.3 g/dL 7.5 7.3 7.0  Total Bilirubin 0.20 - 1.20 mg/dL 0.45 0.49 0.39  Alkaline Phos 40 - 150 U/L 89 91 77  AST 5 - 34 U/L 20 15 15   ALT 0 - 55 U/L 27 18 17    . Lab Results  Component Value Date   IRON 21* 01/28/2016   TIBC 406 01/28/2016   IRONPCTSAT 5* 01/28/2016   (Iron and TIBC)  Lab Results  Component Value Date   FERRITIN 18* 01/28/2016   Component     Latest Ref Rng 01/28/2016 02/25/2016  WBC     4.0 - 10.3 10e3/uL  8.2  NEUT#     1.5 - 6.5 10e3/uL  6.6 (H)  Hemoglobin     13.0 - 17.1 g/dL  9.3 (L)  HCT     38.4 - 49.9 %  30.7 (L)  Platelets     140 - 400 10e3/uL  264  MCV     79.3 - 98.0 fL  77.3 (L)  MCH     27.2 - 33.4 pg  23.4 (L)  MCHC     32.0 - 36.0 g/dL   30.3 (L)  RBC     4.20 - 5.82 10e6/uL  3.97 (L)  RDW     11.0 - 14.6 %  20.4 (H)  lymph#     0.9 - 3.3 10e3/uL  0.7 (L)  MONO#     0.1 - 0.9 10e3/uL  0.5  Eosinophils Absolute     0.0 - 0.5 10e3/uL  0.3  Basophils Absolute     0.0 - 0.1 10e3/uL  0.1  NEUT%     39.0 - 75.0 %  80.1 (H)  LYMPH%     14.0 - 49.0 %  8.6 (L)  MONO%     0.0 - 14.0 %  6.4  EOS%     0.0 - 7.0 %  4.0  BASO%     0.0 - 2.0 %  0.9  Retic %     0.80 - 1.80 %  1.57  Retic Ct Abs     34.80 - 93.90 10e3/uL  62.33  Immature Retic Fract     3.00 - 10.60 %  18.00 (H)  INR     0.8 - 1.2 1.0   Prothrombin Time     9.1 - 12.0 sec 10.5   PFA Interpretation         Collagen / Epinephrine     0 - 193 seconds 125   APTT     24 - 33 sec 26    B12 level: 944  RADIOGRAPHIC STUDIES: I have personally reviewed the radiological images as listed and agreed with the findings in the report. No results found.  ASSESSMENT & PLAN:   75 year old with   #  1 Microcytic hypochromic anemia. Iron deficiency anemia recurrent and likely due to ongoing GI blood losses. Fecal occult blood x 2 was positive on 10/31/2015. The source of GI bleeding thought to be his Ulcers related to his hiatal hernia . Patient has refused surgical option to address his hiatal hernia with ulceration causing recurrent chronic GI bleeds. Patient's hemoglobin is relatively stable at 9.3 but he is becoming more microcytic suggesting increasing iron deficiency. His ferritin level a few weeks back was 18 and the ones from today is pending. Platelet function, PT and PTT were within normal limits and suggest against the presence of a hemostatic defect. Plan -We will set up the patient for IV Feraheme 510 mg every weekly 3 doses as there is indication for ongoing slow GI blood loss. -Follow-up labs from today for his ferritin. -Continue oral B12 and B complex replacement to support accelerated erythropoiesis. -Follow-up with primary care physician to  optimize blood pressure control. -Continue PPI twice a day pre-meals. -absolutely avoid NSAIDs and other blood thinners.  -Continued close follow-up with primary care physician and GI.  RTC with Dr Irene Limbo in 6 weeks with cbc, cmp and ferritin,iron profile.  All of the patients questions were answered to his apparent satisfaction. The patient knows to call the clinic with any problems, questions or concerns.  I spent 15 minutes counseling the patient face to face. The total time spent in the appointment was 20 minutes and more than 50% was on counseling and direct patient cares.    Sullivan Lone MD [place] AAHIVMS [facility] Hematology/Oncology Physician [facility]  (Office):       [phone] (Work cell):  [phone] (Fax):           [phone]  02/25/2016 10:26 AM

## 2016-02-25 NOTE — Telephone Encounter (Signed)
Gave and printed appt sched and avs for pt for April and May °

## 2016-02-26 LAB — VITAMIN B12: Vitamin B12: 1141 pg/mL — ABNORMAL HIGH (ref 211–946)

## 2016-03-03 ENCOUNTER — Ambulatory Visit (HOSPITAL_BASED_OUTPATIENT_CLINIC_OR_DEPARTMENT_OTHER): Payer: Medicare Other

## 2016-03-03 VITALS — BP 147/67 | HR 78 | Temp 98.6°F | Resp 18

## 2016-03-03 DIAGNOSIS — D500 Iron deficiency anemia secondary to blood loss (chronic): Secondary | ICD-10-CM

## 2016-03-03 MED ORDER — SODIUM CHLORIDE 0.9 % IV SOLN
Freq: Once | INTRAVENOUS | Status: AC
  Administered 2016-03-03: 11:00:00 via INTRAVENOUS

## 2016-03-03 MED ORDER — SODIUM CHLORIDE 0.9 % IV SOLN
510.0000 mg | Freq: Once | INTRAVENOUS | Status: AC
  Administered 2016-03-03: 510 mg via INTRAVENOUS
  Filled 2016-03-03: qty 17

## 2016-03-03 NOTE — Patient Instructions (Signed)

## 2016-03-10 ENCOUNTER — Ambulatory Visit (HOSPITAL_BASED_OUTPATIENT_CLINIC_OR_DEPARTMENT_OTHER): Payer: Medicare Other

## 2016-03-10 VITALS — BP 148/73 | HR 86 | Temp 97.1°F | Resp 17

## 2016-03-10 DIAGNOSIS — D500 Iron deficiency anemia secondary to blood loss (chronic): Secondary | ICD-10-CM

## 2016-03-10 MED ORDER — SODIUM CHLORIDE 0.9 % IV SOLN
510.0000 mg | Freq: Once | INTRAVENOUS | Status: AC
  Administered 2016-03-10: 510 mg via INTRAVENOUS
  Filled 2016-03-10: qty 17

## 2016-03-10 MED ORDER — SODIUM CHLORIDE 0.9 % IV SOLN
Freq: Once | INTRAVENOUS | Status: AC
  Administered 2016-03-10: 11:00:00 via INTRAVENOUS

## 2016-03-10 MED ORDER — SODIUM CHLORIDE 0.9 % IJ SOLN
10.0000 mL | INTRAMUSCULAR | Status: DC | PRN
  Filled 2016-03-10: qty 10

## 2016-03-10 NOTE — Patient Instructions (Signed)

## 2016-03-17 ENCOUNTER — Ambulatory Visit (HOSPITAL_BASED_OUTPATIENT_CLINIC_OR_DEPARTMENT_OTHER): Payer: Medicare Other

## 2016-03-17 VITALS — BP 143/78 | HR 91 | Temp 98.5°F | Resp 18

## 2016-03-17 DIAGNOSIS — D500 Iron deficiency anemia secondary to blood loss (chronic): Secondary | ICD-10-CM

## 2016-03-17 MED ORDER — SODIUM CHLORIDE 0.9 % IV SOLN
Freq: Once | INTRAVENOUS | Status: AC
  Administered 2016-03-17: 10:00:00 via INTRAVENOUS

## 2016-03-17 MED ORDER — SODIUM CHLORIDE 0.9 % IV SOLN
510.0000 mg | Freq: Once | INTRAVENOUS | Status: AC
  Administered 2016-03-17: 510 mg via INTRAVENOUS
  Filled 2016-03-17: qty 17

## 2016-03-17 NOTE — Patient Instructions (Signed)

## 2016-03-29 DIAGNOSIS — E119 Type 2 diabetes mellitus without complications: Secondary | ICD-10-CM | POA: Diagnosis not present

## 2016-03-29 DIAGNOSIS — D649 Anemia, unspecified: Secondary | ICD-10-CM | POA: Diagnosis not present

## 2016-03-29 DIAGNOSIS — I10 Essential (primary) hypertension: Secondary | ICD-10-CM | POA: Diagnosis not present

## 2016-04-05 DIAGNOSIS — E119 Type 2 diabetes mellitus without complications: Secondary | ICD-10-CM | POA: Diagnosis not present

## 2016-04-05 DIAGNOSIS — I10 Essential (primary) hypertension: Secondary | ICD-10-CM | POA: Diagnosis not present

## 2016-04-05 DIAGNOSIS — E7800 Pure hypercholesterolemia, unspecified: Secondary | ICD-10-CM | POA: Diagnosis not present

## 2016-04-05 DIAGNOSIS — D509 Iron deficiency anemia, unspecified: Secondary | ICD-10-CM | POA: Diagnosis not present

## 2016-04-07 ENCOUNTER — Other Ambulatory Visit (HOSPITAL_BASED_OUTPATIENT_CLINIC_OR_DEPARTMENT_OTHER): Payer: Medicare Other

## 2016-04-07 DIAGNOSIS — D500 Iron deficiency anemia secondary to blood loss (chronic): Secondary | ICD-10-CM

## 2016-04-07 DIAGNOSIS — K922 Gastrointestinal hemorrhage, unspecified: Secondary | ICD-10-CM

## 2016-04-07 LAB — CBC & DIFF AND RETIC
BASO%: 0.6 % (ref 0.0–2.0)
BASOS ABS: 0 10*3/uL (ref 0.0–0.1)
EOS%: 5.2 % (ref 0.0–7.0)
Eosinophils Absolute: 0.4 10*3/uL (ref 0.0–0.5)
HEMATOCRIT: 34.6 % — AB (ref 38.4–49.9)
HEMOGLOBIN: 10.9 g/dL — AB (ref 13.0–17.1)
IMMATURE RETIC FRACT: 14.5 % — AB (ref 3.00–10.60)
LYMPH%: 12.5 % — ABNORMAL LOW (ref 14.0–49.0)
MCH: 27.1 pg — ABNORMAL LOW (ref 27.2–33.4)
MCHC: 31.5 g/dL — ABNORMAL LOW (ref 32.0–36.0)
MCV: 86.1 fL (ref 79.3–98.0)
MONO#: 0.5 10*3/uL (ref 0.1–0.9)
MONO%: 6.4 % (ref 0.0–14.0)
NEUT#: 5.3 10*3/uL (ref 1.5–6.5)
NEUT%: 75.3 % — AB (ref 39.0–75.0)
PLATELETS: 239 10*3/uL (ref 140–400)
RBC: 4.02 10*6/uL — ABNORMAL LOW (ref 4.20–5.82)
RDW: 22.2 % — AB (ref 11.0–14.6)
RETIC %: 2.25 % — AB (ref 0.80–1.80)
RETIC CT ABS: 90.45 10*3/uL (ref 34.80–93.90)
WBC: 7.1 10*3/uL (ref 4.0–10.3)
lymph#: 0.9 10*3/uL (ref 0.9–3.3)

## 2016-04-21 ENCOUNTER — Telehealth: Payer: Self-pay | Admitting: Hematology

## 2016-04-21 ENCOUNTER — Ambulatory Visit (HOSPITAL_BASED_OUTPATIENT_CLINIC_OR_DEPARTMENT_OTHER): Payer: Medicare Other | Admitting: Hematology

## 2016-04-21 ENCOUNTER — Encounter: Payer: Self-pay | Admitting: Hematology

## 2016-04-21 ENCOUNTER — Other Ambulatory Visit (HOSPITAL_BASED_OUTPATIENT_CLINIC_OR_DEPARTMENT_OTHER): Payer: Medicare Other

## 2016-04-21 VITALS — BP 150/82 | HR 89 | Temp 97.6°F | Resp 18 | Ht 70.0 in | Wt 251.5 lb

## 2016-04-21 DIAGNOSIS — E538 Deficiency of other specified B group vitamins: Secondary | ICD-10-CM

## 2016-04-21 DIAGNOSIS — D500 Iron deficiency anemia secondary to blood loss (chronic): Secondary | ICD-10-CM | POA: Diagnosis not present

## 2016-04-21 DIAGNOSIS — K922 Gastrointestinal hemorrhage, unspecified: Secondary | ICD-10-CM | POA: Diagnosis not present

## 2016-04-21 LAB — CBC & DIFF AND RETIC
BASO%: 0.7 % (ref 0.0–2.0)
Basophils Absolute: 0.1 10*3/uL (ref 0.0–0.1)
EOS ABS: 0.4 10*3/uL (ref 0.0–0.5)
EOS%: 5.5 % (ref 0.0–7.0)
HCT: 35.1 % — ABNORMAL LOW (ref 38.4–49.9)
HEMOGLOBIN: 11 g/dL — AB (ref 13.0–17.1)
IMMATURE RETIC FRACT: 15.2 % — AB (ref 3.00–10.60)
LYMPH%: 16 % (ref 14.0–49.0)
MCH: 26.8 pg — ABNORMAL LOW (ref 27.2–33.4)
MCHC: 31.3 g/dL — ABNORMAL LOW (ref 32.0–36.0)
MCV: 85.4 fL (ref 79.3–98.0)
MONO#: 0.4 10*3/uL (ref 0.1–0.9)
MONO%: 5.9 % (ref 0.0–14.0)
NEUT%: 71.9 % (ref 39.0–75.0)
NEUTROS ABS: 4.8 10*3/uL (ref 1.5–6.5)
Platelets: 278 10*3/uL (ref 140–400)
RBC: 4.11 10*6/uL — ABNORMAL LOW (ref 4.20–5.82)
RDW: 19.5 % — ABNORMAL HIGH (ref 11.0–14.6)
RETIC CT ABS: 64.12 10*3/uL (ref 34.80–93.90)
Retic %: 1.56 % (ref 0.80–1.80)
WBC: 6.7 10*3/uL (ref 4.0–10.3)
lymph#: 1.1 10*3/uL (ref 0.9–3.3)

## 2016-04-21 NOTE — Telephone Encounter (Signed)
Gave pt apt & avs °

## 2016-04-21 NOTE — Progress Notes (Signed)
All .    HEMATOLOGY/ONCOLOGY CLINIC NOTE  Date of Service: 04/21/2016  Patient Care Team: [name], MD as PCP - General (Internal Medicine)   Gastroenterology: Dr. 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 Paulita Fujita showed benign polyps. He was recommended repeat colonoscopy in 3-5 years. EGD done 04/18/1215 showed no overt bleeding issues.  Current Treatment IV feraheme as needed to maintain ferritin >100 B complex 1 cap po daily B12 500 g daily orally  HISTORY OF PRESENTING ILLNESS:  (Plz see initial consultation for details regarding initial presentation)  INTERVAL HISTORY  Mr Fairley is here for f/u regarding his iron deficiency anemia. He notes his energy levels have much improved. His hemoglobin has improved to 11. RBC macrocytosis has resolved. He notes no further overt GI bleeding. Tolerated his last 3 doses of IV Feraheme on 4/12, 4/19 and 03/17/2016 without any issues.  MEDICAL HISTORY:  Past Medical History  Diagnosis Date  . Hypertension   . Hyperlipidemia   . Anemia   . Meningitis   . Iron deficiency   . Macular degeneration   . Diabetes mellitus without complication ([place])     SURGICAL HISTORY: Past Surgical History  Procedure Laterality Date  . Tonsillectomy    . Appendectomy    . Hernia repair    . Esophagogastroduodenoscopy    . Colonoscopy    . Givens capsule study N/A 11/10/2015    Procedure: GIVENS CAPSULE STUDY;  Surgeon: Arta Silence, MD;  Location: [facility] ENDOSCOPY;  Service: Endoscopy;  Laterality: N/A;  . Esophagogastroduodenoscopy (egd) with propofol N/A 12/10/2015    Procedure: ESOPHAGOGASTRODUODENOSCOPY (EGD) WITH PROPOFOL;  Surgeon: Wilford Corner, MD;  Location: [facility] ENDOSCOPY;  Service:  Gastroenterology;  Laterality: N/A;    SOCIAL HISTORY: Social History   Social History  . Marital Status: Single    Spouse Name: N/A  . Number of Children: N/A  . Years of Education: N/A   Occupational History  . Not on file.   Social History Main Topics  . Smoking status: Never Smoker   . Smokeless tobacco: Never Used  . Alcohol Use: No  . Drug Use: No  . Sexual Activity: No   Other Topics Concern  . Not on file   Social History Narrative    FAMILY HISTORY: Family History  Problem Relation Age of Onset  . CAD Mother     ALLERGIES:  has No Known Allergies.  MEDICATIONS:  Current Outpatient Prescriptions  Medication Sig Dispense Refill  . MAGNESIUM PO Take by mouth daily.    Marland Kitchen ACCU-CHEK AVIVA PLUS test strip     . amLODipine (NORVASC) 5 MG tablet Take 7.5 mg by mouth every evening.     . Ascorbic Acid (VITAMIN C) 1000 MG tablet Take 1,000 mg by mouth daily.    Marland Kitchen atorvastatin (LIPITOR) 80 MG tablet Take 80 mg by mouth daily at 6 PM.     . b complex vitamins capsule Take 1 capsule by mouth daily.    Marland Kitchen docusate sodium (COLACE) 100 MG capsule Take 2 capsules (200 mg total) by mouth at bedtime as needed for mild constipation or moderate constipation. 60 capsule 0  . Fish Oil-Cholecalciferol (OMEGA-3 + VITAMIN D3 PO) Take 1 tablet by mouth daily.    . fluticasone (  FLONASE) 50 MCG/ACT nasal spray Place 2 sprays into both nostrils daily as needed for allergies or rhinitis.     Marland Kitchen glimepiride (AMARYL) 2 MG tablet Take 2 mg by mouth daily with breakfast.    . metFORMIN (GLUCOPHAGE) 500 MG tablet Take 500 mg by mouth every evening.     . Multiple Vitamins-Minerals (ICAPS) CAPS Take 1 capsule by mouth daily.     No current facility-administered medications for this visit.   Facility-Administered Medications Ordered in Other Visits  Medication Dose Route Frequency Provider Last Rate Last Dose  . 0.9 %  sodium chloride infusion   Intravenous Once [name], MD      .  acetaminophen (TYLENOL) tablet 650 mg  650 mg Oral Once [name], MD      . diphenhydrAMINE (BENADRYL) capsule 25 mg  25 mg Oral Once [name], MD        REVIEW OF SYSTEMS:    10 Point review of Systems was done is negative except as noted above.  PHYSICAL EXAMINATION: ECOG PERFORMANCE STATUS: 2 - Symptomatic, <50% confined to bed  . Filed Vitals:   04/21/16 0934  Height: 5\' 10"  (1.778 m)  Weight: 251 lb 8 oz (114.08 kg)   Filed Weights   04/21/16 0934  Weight: 251 lb 8 oz (114.08 kg)   .Body mass index is 36.09 kg/(m^2).  GENERAL:alert, in no acute distress and comfortable SKIN: skin color, texture, turgor are normal, no rashes or significant lesions EYES: normal, conjunctiva are pink and non-injected, sclera clear OROPHARYNX:no exudate, no erythema and lips, buccal mucosa, and tongue normal  NECK: supple, no JVD, thyroid normal size, non-tender, without nodularity LYMPH:  no palpable lymphadenopathy in the cervical, axillary or inguinal LUNGS: clear to auscultation with normal respiratory effort HEART: regular rate & rhythm,  no murmurs and no lower extremity edema ABDOMEN: abdomen soft, non-tender, normoactive bowel sounds , no hepatosplenomegaly  Musculoskeletal: no cyanosis of digits and no clubbing  PSYCH: alert & oriented x 3 with fluent speech NEURO: no focal motor/sensory deficits  LABORATORY DATA:  I have reviewed the data as listed  . CBC Latest Ref Rng 04/21/2016 04/07/2016 02/25/2016  WBC 4.0 - 10.3 10e3/uL 6.7 7.1 8.2  Hemoglobin 13.0 - 17.1 g/dL 11.0(L) 10.9(L) 9.3(L)  Hematocrit 38.4 - 49.9 % 35.1(L) 34.6(L) 30.7(L)  Platelets 140 - 400 10e3/uL 278 239 264   . CBC    Component Value Date/Time   WBC 6.7 04/21/2016 0922   WBC 7.3 12/11/2015 0831   RBC 4.11* 04/21/2016 0922   RBC 4.01* 12/11/2015 0831   HGB 11.0* 04/21/2016 0922   HGB 8.1* 12/11/2015 0831   HCT 35.1* 04/21/2016 0922   HCT 29.0* 12/11/2015 0831   HCT 18.7* 12/19/2014 1830   PLT 278  04/21/2016 0922   PLT 321 12/11/2015 0831   MCV 85.4 04/21/2016 0922   MCV 72.3* 12/11/2015 0831   MCH 26.8* 04/21/2016 0922   MCH 20.2* 12/11/2015 0831   MCHC 31.3* 04/21/2016 0922   MCHC 27.9* 12/11/2015 0831   RDW 19.5* 04/21/2016 0922   RDW 23.5* 12/11/2015 0831   LYMPHSABS 1.1 04/21/2016 0922   MONOABS 0.4 04/21/2016 0922   EOSABS 0.4 04/21/2016 0922   BASOSABS 0.1 04/21/2016 0922     . CMP Latest Ref Rng 02/25/2016 12/08/2015 08/12/2015  Glucose 70 - 140 mg/dl 156(H) 161(H) 122  BUN 7.0 - 26.0 mg/dL 18.2 12.4 19.1  Creatinine 0.7 - 1.3 mg/dL 1.0 1.2 1.1  Sodium 136 -  145 mEq/L 141 140 142  Potassium 3.5 - 5.1 mEq/L 4.3 4.4 4.5  Chloride 96 - 112 mmol/L - - -  CO2 22 - 29 mEq/L 25 25 25   Calcium 8.4 - 10.4 mg/dL 9.1 8.9 9.1  Total Protein 6.4 - 8.3 g/dL 7.5 7.3 7.0  Total Bilirubin 0.20 - 1.20 mg/dL 0.45 0.49 0.39  Alkaline Phos 40 - 150 U/L 89 91 77  AST 5 - 34 U/L 20 15 15   ALT 0 - 55 U/L 27 18 17     RADIOGRAPHIC STUDIES: I have personally reviewed the radiological images as listed and agreed with the findings in the report. No results found.  ASSESSMENT & PLAN:   75 year old with   #1 Microcytic hypochromic anemia. Iron deficiency anemia recurrent and likely due to ongoing GI blood losses. Fecal occult blood x 2 was positive on 10/31/2015. The source of GI bleeding thought to be his Ulcers related to his hiatal hernia . Patient has refused surgical option to address his hiatal hernia with ulceration causing recurrent chronic GI bleeds. Patient's hemoglobin has improved to 11 with resolution of microcytosis. Last received IV Feraheme 510 mg x 3 doses on 4/12, 4/19 and 03/17/2016. Platelet function, PT and PTT were within normal limits and suggest against the presence of a hemostatic defect. Plan -Patient's hemoglobin continues to improve. His microcytosis is resolved. No indication for additional IV iron at this time. -Offered him oral iron for maintenance purposes  but wants to hold off at this time. --Continue oral B12 and B complex replacement to support accelerated erythropoiesis. -Follow-up with primary care physician to optimize blood pressure control. -Continue PPI twice a day pre-meals. -absolutely avoid NSAIDs and other blood thinners.   -Continued close follow-up with primary care physician and GI.  RTC with Dr Irene Limbo in 8 weeks with cbc, cmp and ferritin,iron profile and B12  All of the patients questions were answered to his apparent satisfaction. The patient knows to call the clinic with any problems, questions or concerns.  I spent 15 minutes counseling the patient face to face. The total time spent in the appointment was 20 minutes and more than 50% was on counseling and direct patient cares.    Sullivan Lone MD [place] AAHIVMS [facility] Hematology/Oncology Physician [facility]  (Office):       [phone] (Work cell):  [phone] (Fax):           [phone]  04/21/2016 10:37 AM

## 2016-06-17 DIAGNOSIS — Z961 Presence of intraocular lens: Secondary | ICD-10-CM | POA: Diagnosis not present

## 2016-06-17 DIAGNOSIS — H353120 Nonexudative age-related macular degeneration, left eye, stage unspecified: Secondary | ICD-10-CM | POA: Diagnosis not present

## 2016-06-17 DIAGNOSIS — E119 Type 2 diabetes mellitus without complications: Secondary | ICD-10-CM | POA: Diagnosis not present

## 2016-06-17 DIAGNOSIS — H353110 Nonexudative age-related macular degeneration, right eye, stage unspecified: Secondary | ICD-10-CM | POA: Diagnosis not present

## 2016-06-22 ENCOUNTER — Ambulatory Visit (HOSPITAL_BASED_OUTPATIENT_CLINIC_OR_DEPARTMENT_OTHER): Payer: Medicare Other | Admitting: Hematology

## 2016-06-22 ENCOUNTER — Encounter: Payer: Self-pay | Admitting: Hematology

## 2016-06-22 ENCOUNTER — Other Ambulatory Visit (HOSPITAL_BASED_OUTPATIENT_CLINIC_OR_DEPARTMENT_OTHER): Payer: Medicare Other

## 2016-06-22 ENCOUNTER — Telehealth: Payer: Self-pay | Admitting: Hematology

## 2016-06-22 VITALS — BP 140/67 | HR 89 | Temp 98.1°F | Resp 17 | Ht 70.0 in | Wt 251.6 lb

## 2016-06-22 DIAGNOSIS — E538 Deficiency of other specified B group vitamins: Secondary | ICD-10-CM | POA: Diagnosis not present

## 2016-06-22 DIAGNOSIS — D509 Iron deficiency anemia, unspecified: Secondary | ICD-10-CM

## 2016-06-22 DIAGNOSIS — D500 Iron deficiency anemia secondary to blood loss (chronic): Secondary | ICD-10-CM | POA: Diagnosis not present

## 2016-06-22 DIAGNOSIS — K922 Gastrointestinal hemorrhage, unspecified: Secondary | ICD-10-CM

## 2016-06-22 LAB — FERRITIN: Ferritin: 9 ng/ml — ABNORMAL LOW (ref 22–316)

## 2016-06-22 LAB — CBC & DIFF AND RETIC
BASO%: 0.6 % (ref 0.0–2.0)
Basophils Absolute: 0 10*3/uL (ref 0.0–0.1)
EOS%: 3.5 % (ref 0.0–7.0)
Eosinophils Absolute: 0.2 10*3/uL (ref 0.0–0.5)
HCT: 30.1 % — ABNORMAL LOW (ref 38.4–49.9)
HGB: 9.1 g/dL — ABNORMAL LOW (ref 13.0–17.1)
IMMATURE RETIC FRACT: 16.7 % — AB (ref 3.00–10.60)
LYMPH%: 10.5 % — AB (ref 14.0–49.0)
MCH: 22.6 pg — AB (ref 27.2–33.4)
MCHC: 30.2 g/dL — ABNORMAL LOW (ref 32.0–36.0)
MCV: 74.9 fL — AB (ref 79.3–98.0)
MONO#: 0.4 10*3/uL (ref 0.1–0.9)
MONO%: 5.3 % (ref 0.0–14.0)
NEUT#: 5.6 10*3/uL (ref 1.5–6.5)
NEUT%: 80.1 % — AB (ref 39.0–75.0)
PLATELETS: 271 10*3/uL (ref 140–400)
RBC: 4.02 10*6/uL — AB (ref 4.20–5.82)
RDW: 19 % — ABNORMAL HIGH (ref 11.0–14.6)
Retic %: 1.44 % (ref 0.80–1.80)
Retic Ct Abs: 57.89 10*3/uL (ref 34.80–93.90)
WBC: 6.9 10*3/uL (ref 4.0–10.3)
lymph#: 0.7 10*3/uL — ABNORMAL LOW (ref 0.9–3.3)

## 2016-06-22 LAB — COMPREHENSIVE METABOLIC PANEL
ALK PHOS: 100 U/L (ref 40–150)
ALT: 21 U/L (ref 0–55)
ANION GAP: 8 meq/L (ref 3–11)
AST: 16 U/L (ref 5–34)
Albumin: 3.5 g/dL (ref 3.5–5.0)
BILIRUBIN TOTAL: 0.53 mg/dL (ref 0.20–1.20)
BUN: 15.6 mg/dL (ref 7.0–26.0)
CO2: 27 meq/L (ref 22–29)
Calcium: 9.5 mg/dL (ref 8.4–10.4)
Chloride: 106 mEq/L (ref 98–109)
Creatinine: 1.1 mg/dL (ref 0.7–1.3)
EGFR: 63 mL/min/{1.73_m2} — AB (ref >90)
Glucose: 159 mg/dl — ABNORMAL HIGH (ref 70–140)
POTASSIUM: 4.6 meq/L (ref 3.5–5.1)
Sodium: 141 mEq/L (ref 136–145)
TOTAL PROTEIN: 7.5 g/dL (ref 6.4–8.3)

## 2016-06-22 NOTE — Telephone Encounter (Signed)
Gave pt cal & avs °

## 2016-06-22 NOTE — Progress Notes (Signed)
HEMATOLOGY/ONCOLOGY CLINIC NOTE  Date of Service: 06/22/2016  Patient Care Team: [name], MD as PCP - General (Internal Medicine)   Gastroenterology: Dr. 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 Paulita Fujita showed benign polyps. He was recommended repeat colonoscopy in 3-5 years. EGD done 04/18/1215 showed no overt bleeding issues.  Current Treatment IV feraheme as needed to maintain ferritin >100 B complex 1 cap po daily B12 500 g daily orally  HISTORY OF PRESENTING ILLNESS:  (Plz see initial consultation for details regarding initial presentation)  INTERVAL HISTORY  James Cooke is here for f/u regarding his iron deficiency anemia. He notes his energy levels may be gradually declining again. He notes some intermittent dark stools but no overt bright red blood per rectum. His hemoglobin has dropped down to 9.1 from his previous of 11 and his ferritin level is down to 9. He is agreeable with aggressive IV iron replacement. He denies taking any over-the-counter NSAIDs. No significant abdominal pain. Has not been on his oral iron replacement as per our recommendations.  MEDICAL HISTORY:  Past Medical History:  Diagnosis Date  . Anemia   . Diabetes mellitus without complication ([place])   . Hyperlipidemia   . Hypertension   . Iron deficiency   . Macular degeneration   . Meningitis     SURGICAL HISTORY: Past Surgical History:  Procedure Laterality Date  . APPENDECTOMY    . COLONOSCOPY    . ESOPHAGOGASTRODUODENOSCOPY    . ESOPHAGOGASTRODUODENOSCOPY (EGD) WITH PROPOFOL N/A 12/10/2015   Procedure: ESOPHAGOGASTRODUODENOSCOPY (EGD) WITH PROPOFOL;  Surgeon: Wilford Corner, MD;  Location: [facility] ENDOSCOPY;  Service: Gastroenterology;   Laterality: N/A;  . GIVENS CAPSULE STUDY N/A 11/10/2015   Procedure: GIVENS CAPSULE STUDY;  Surgeon: Arta Silence, MD;  Location: [facility] ENDOSCOPY;  Service: Endoscopy;  Laterality: N/A;  . HERNIA REPAIR    . TONSILLECTOMY      SOCIAL HISTORY: Social History   Social History  . Marital status: Single    Spouse name: N/A  . Number of children: N/A  . Years of education: N/A   Occupational History  . Not on file.   Social History Main Topics  . Smoking status: Never Smoker  . Smokeless tobacco: Never Used  . Alcohol use No  . Drug use: No  . Sexual activity: No   Other Topics Concern  . Not on file   Social History Narrative  . No narrative on file    FAMILY HISTORY: Family History  Problem Relation Age of Onset  . CAD Mother     ALLERGIES:  has No Known Allergies.  MEDICATIONS:  Current Outpatient Prescriptions  Medication Sig Dispense Refill  . ACCU-CHEK AVIVA PLUS test strip     . amLODipine (NORVASC) 5 MG tablet Take 7.5 mg by mouth every evening.     . Ascorbic Acid (VITAMIN C) 1000 MG tablet Take 1,000 mg by mouth daily.    Marland Kitchen atorvastatin (LIPITOR) 80 MG tablet Take 80 mg by mouth daily at 6 PM.     . b complex vitamins capsule Take 1 capsule by mouth daily.    Marland Kitchen CALCIUM-MAGNESIUM-VITAMIN D ER PO Take by mouth.    . docusate sodium (COLACE) 100 MG capsule Take 2 capsules (200 mg total)  by mouth at bedtime as needed for mild constipation or moderate constipation. 60 capsule 0  . Fish Oil-Cholecalciferol (OMEGA-3 + VITAMIN D3 PO) Take 1 tablet by mouth daily.    . fluticasone (FLONASE) 50 MCG/ACT nasal spray Place 2 sprays into both nostrils daily as needed for allergies or rhinitis.     Marland Kitchen glimepiride (AMARYL) 2 MG tablet Take 2 mg by mouth daily with breakfast.    . metFORMIN (GLUCOPHAGE) 500 MG tablet Take 500 mg by mouth every evening.     . Multiple Vitamins-Minerals (ICAPS) CAPS Take 1 capsule by mouth daily.     No current facility-administered  medications for this visit.    Facility-Administered Medications Ordered in Other Visits  Medication Dose Route Frequency Provider Last Rate Last Dose  . 0.9 %  sodium chloride infusion   Intravenous Once [name], MD      . acetaminophen (TYLENOL) tablet 650 mg  650 mg Oral Once [name], MD      . diphenhydrAMINE (BENADRYL) capsule 25 mg  25 mg Oral Once [name], MD        REVIEW OF SYSTEMS:    10 Point review of Systems was done is negative except as noted above.  PHYSICAL EXAMINATION: ECOG PERFORMANCE STATUS: 2 - Symptomatic, <50% confined to bed  . Vitals:   06/22/16 1037  Weight: 251 lb 9.6 oz (114.1 kg)  Height: 5\' 10"  (1.778 m)   Filed Weights   06/22/16 1037  Weight: 251 lb 9.6 oz (114.1 kg)   .Body mass index is 36.1 kg/m.  GENERAL:alert, in no acute distress and comfortable SKIN: skin color, texture, turgor are normal, no rashes or significant lesions EYES: normal, conjunctiva are pink and non-injected, sclera clear OROPHARYNX:no exudate, no erythema and lips, buccal mucosa, and tongue normal  NECK: supple, no JVD, thyroid normal size, non-tender, without nodularity LYMPH:  no palpable lymphadenopathy in the cervical, axillary or inguinal LUNGS: clear to auscultation with normal respiratory effort HEART: regular rate & rhythm,  no murmurs and no lower extremity edema ABDOMEN: abdomen soft, non-tender, normoactive bowel sounds , no hepatosplenomegaly  Musculoskeletal: no cyanosis of digits and no clubbing  PSYCH: alert & oriented x 3 with fluent speech NEURO: no focal motor/sensory deficits  LABORATORY DATA:  I have reviewed the data as listed  . CBC Latest Ref Rng & Units 06/22/2016 04/21/2016 04/07/2016  WBC 4.0 - 10.3 10e3/uL 6.9 6.7 7.1  Hemoglobin 13.0 - 17.1 g/dL 9.1(L) 11.0(L) 10.9(L)  Hematocrit 38.4 - 49.9 % 30.1(L) 35.1(L) 34.6(L)  Platelets 140 - 400 10e3/uL 271 278 239   . CBC    Component Value Date/Time   WBC 6.9 06/22/2016 0944   WBC  7.3 12/11/2015 0831   RBC 4.02 (L) 06/22/2016 0944   RBC 4.01 (L) 12/11/2015 0831   HGB 9.1 (L) 06/22/2016 0944   HCT 30.1 (L) 06/22/2016 0944   PLT 271 06/22/2016 0944   MCV 74.9 (L) 06/22/2016 0944   MCH 22.6 (L) 06/22/2016 0944   MCH 20.2 (L) 12/11/2015 0831   MCHC 30.2 (L) 06/22/2016 0944   MCHC 27.9 (L) 12/11/2015 0831   RDW 19.0 (H) 06/22/2016 0944   LYMPHSABS 0.7 (L) 06/22/2016 0944   MONOABS 0.4 06/22/2016 0944   EOSABS 0.2 06/22/2016 0944   BASOSABS 0.0 06/22/2016 0944     . CMP Latest Ref Rng & Units 06/22/2016 02/25/2016 12/08/2015  Glucose 70 - 140 mg/dl 159(H) 156(H) 161(H)  BUN 7.0 - 26.0 mg/dL 15.6 18.2  12.4  Creatinine 0.7 - 1.3 mg/dL 1.1 1.0 1.2  Sodium 136 - 145 mEq/L 141 141 140  Potassium 3.5 - 5.1 mEq/L 4.6 4.3 4.4  Chloride 96 - 112 mmol/L - - -  CO2 22 - 29 mEq/L 27 25 25   Calcium 8.4 - 10.4 mg/dL 9.5 9.1 8.9  Total Protein 6.4 - 8.3 g/dL 7.5 7.5 7.3  Total Bilirubin 0.20 - 1.20 mg/dL 0.53 0.45 0.49  Alkaline Phos 40 - 150 U/L 100 89 91  AST 5 - 34 U/L 16 20 15   ALT 0 - 55 U/L 21 27 18     LABORATORY DATA:  I have reviewed the data as listed  . CBC Latest Ref Rng & Units 06/22/2016 04/21/2016 04/07/2016  WBC 4.0 - 10.3 10e3/uL 6.9 6.7 7.1  Hemoglobin 13.0 - 17.1 g/dL 9.1(L) 11.0(L) 10.9(L)  Hematocrit 38.4 - 49.9 % 30.1(L) 35.1(L) 34.6(L)  Platelets 140 - 400 10e3/uL 271 278 239    . CMP Latest Ref Rng & Units 06/22/2016 02/25/2016 12/08/2015  Glucose 70 - 140 mg/dl 159(H) 156(H) 161(H)  BUN 7.0 - 26.0 mg/dL 15.6 18.2 12.4  Creatinine 0.7 - 1.3 mg/dL 1.1 1.0 1.2  Sodium 136 - 145 mEq/L 141 141 140  Potassium 3.5 - 5.1 mEq/L 4.6 4.3 4.4  Chloride 96 - 112 mmol/L - - -  CO2 22 - 29 mEq/L 27 25 25   Calcium 8.4 - 10.4 mg/dL 9.5 9.1 8.9  Total Protein 6.4 - 8.3 g/dL 7.5 7.5 7.3  Total Bilirubin 0.20 - 1.20 mg/dL 0.53 0.45 0.49  Alkaline Phos 40 - 150 U/L 100 89 91  AST 5 - 34 U/L 16 20 15   ALT 0 - 55 U/L 21 27 18     Lab Results  Component Value Date    FERRITIN 9 (L) 06/22/2016     RADIOGRAPHIC STUDIES: I have personally reviewed the radiological images as listed and agreed with the findings in the report. No results found.  RADIOGRAPHIC STUDIES: I have personally reviewed the radiological images as listed and agreed with the findings in the report. No results found.  ASSESSMENT & PLAN:   75 year old with   #1 Microcytic hypochromic anemia. Iron deficiency anemia recurrent and likely due to ongoing GI blood losses. Fecal occult blood x 2 was positive on 10/31/2015. The source of GI bleeding thought to be his Ulcers related to his hiatal hernia . Patient has refused surgical option to address his hiatal hernia with ulceration causing recurrent chronic GI bleeds. Patient's hemoglobin has improved to 11 with resolution of microcytosis. Last received IV Feraheme 510 mg x 3 doses on 4/12, 4/19 and 03/17/2016. Platelet function, PT and PTT were within normal limits and suggest against the presence of a hemostatic defect. Plan -Patient's hemoglobin levels are gradually dropping down to 9.1. His ferritin level has dropped to 9. Appears to be likely related to ongoing GI bleeding. He continues to refuse surgical option to treat his hiatal hernia with ulcerations. -Reports compliance with PPIs. -Agreeable with proceeding with additional IV iron replacement at this time. -Will order IV Feraheme 510 mg every weekly 3 doses. --Continue oral B12 and B complex replacement to support accelerated erythropoiesis. -Follow-up with primary care physician to optimize blood pressure control. -Continue PPI twice a day pre-meals. -absolutely avoid NSAIDs and other blood thinners.   -Continued close follow-up with primary care physician and GI.  RTC with Dr Irene Limbo in 8 weeks with cbc, cmp and ferritin,iron profile and B12 Return to care for  IV Feraheme every weekly 3 doses   All of the patients questions were answered to his apparent satisfaction. The  patient knows to call the clinic with any problems, questions or concerns.  I spent 15 minutes counseling the patient face to face. The total time spent in the appointment was 20 minutes and more than 50% was on counseling and direct patient cares.    Sullivan Lone MD [place] AAHIVMS [facility] Hematology/Oncology Physician [facility]  (Office):       [phone] (Work cell):  [phone] (Fax):           [phone]  06/22/2016 12:33 PM

## 2016-06-23 LAB — VITAMIN B12

## 2016-06-30 ENCOUNTER — Ambulatory Visit (HOSPITAL_BASED_OUTPATIENT_CLINIC_OR_DEPARTMENT_OTHER): Payer: Medicare Other

## 2016-06-30 VITALS — BP 140/62 | HR 92 | Temp 97.9°F | Resp 20

## 2016-06-30 DIAGNOSIS — D500 Iron deficiency anemia secondary to blood loss (chronic): Secondary | ICD-10-CM

## 2016-06-30 MED ORDER — SODIUM CHLORIDE 0.9 % IV SOLN
510.0000 mg | Freq: Once | INTRAVENOUS | Status: AC
  Administered 2016-06-30: 510 mg via INTRAVENOUS
  Filled 2016-06-30: qty 17

## 2016-06-30 MED ORDER — SODIUM CHLORIDE 0.9 % IV SOLN
Freq: Once | INTRAVENOUS | Status: AC
  Administered 2016-06-30: 08:00:00 via INTRAVENOUS

## 2016-06-30 NOTE — Patient Instructions (Signed)

## 2016-07-07 ENCOUNTER — Ambulatory Visit (HOSPITAL_BASED_OUTPATIENT_CLINIC_OR_DEPARTMENT_OTHER): Payer: Medicare Other

## 2016-07-07 VITALS — BP 164/76 | HR 94 | Temp 98.6°F | Resp 20

## 2016-07-07 DIAGNOSIS — D500 Iron deficiency anemia secondary to blood loss (chronic): Secondary | ICD-10-CM

## 2016-07-07 MED ORDER — FERUMOXYTOL INJECTION 510 MG/17 ML
510.0000 mg | Freq: Once | INTRAVENOUS | Status: AC
  Administered 2016-07-07: 510 mg via INTRAVENOUS
  Filled 2016-07-07: qty 17

## 2016-07-07 NOTE — Patient Instructions (Signed)

## 2016-07-14 ENCOUNTER — Ambulatory Visit (HOSPITAL_BASED_OUTPATIENT_CLINIC_OR_DEPARTMENT_OTHER): Payer: Medicare Other

## 2016-07-14 VITALS — BP 160/64 | HR 90 | Temp 99.1°F | Resp 20

## 2016-07-14 DIAGNOSIS — D500 Iron deficiency anemia secondary to blood loss (chronic): Secondary | ICD-10-CM | POA: Diagnosis not present

## 2016-07-14 DIAGNOSIS — K449 Diaphragmatic hernia without obstruction or gangrene: Secondary | ICD-10-CM | POA: Diagnosis not present

## 2016-07-14 DIAGNOSIS — D509 Iron deficiency anemia, unspecified: Secondary | ICD-10-CM | POA: Diagnosis not present

## 2016-07-14 MED ORDER — SODIUM CHLORIDE 0.9 % IV SOLN
510.0000 mg | Freq: Once | INTRAVENOUS | Status: AC
  Administered 2016-07-14: 510 mg via INTRAVENOUS
  Filled 2016-07-14: qty 17

## 2016-07-14 NOTE — Patient Instructions (Signed)

## 2016-08-02 DIAGNOSIS — E119 Type 2 diabetes mellitus without complications: Secondary | ICD-10-CM | POA: Diagnosis not present

## 2016-08-02 DIAGNOSIS — D509 Iron deficiency anemia, unspecified: Secondary | ICD-10-CM | POA: Diagnosis not present

## 2016-08-02 DIAGNOSIS — I10 Essential (primary) hypertension: Secondary | ICD-10-CM | POA: Diagnosis not present

## 2016-08-06 DIAGNOSIS — E7800 Pure hypercholesterolemia, unspecified: Secondary | ICD-10-CM | POA: Diagnosis not present

## 2016-08-06 DIAGNOSIS — E119 Type 2 diabetes mellitus without complications: Secondary | ICD-10-CM | POA: Diagnosis not present

## 2016-08-06 DIAGNOSIS — Z23 Encounter for immunization: Secondary | ICD-10-CM | POA: Diagnosis not present

## 2016-08-06 DIAGNOSIS — D649 Anemia, unspecified: Secondary | ICD-10-CM | POA: Diagnosis not present

## 2016-08-06 DIAGNOSIS — I10 Essential (primary) hypertension: Secondary | ICD-10-CM | POA: Diagnosis not present

## 2016-08-16 ENCOUNTER — Other Ambulatory Visit: Payer: Self-pay | Admitting: *Deleted

## 2016-08-16 DIAGNOSIS — E538 Deficiency of other specified B group vitamins: Secondary | ICD-10-CM

## 2016-08-16 DIAGNOSIS — D500 Iron deficiency anemia secondary to blood loss (chronic): Secondary | ICD-10-CM

## 2016-08-17 ENCOUNTER — Ambulatory Visit (HOSPITAL_BASED_OUTPATIENT_CLINIC_OR_DEPARTMENT_OTHER): Payer: Medicare Other | Admitting: Hematology

## 2016-08-17 ENCOUNTER — Other Ambulatory Visit (HOSPITAL_BASED_OUTPATIENT_CLINIC_OR_DEPARTMENT_OTHER): Payer: Medicare Other

## 2016-08-17 ENCOUNTER — Encounter: Payer: Self-pay | Admitting: Hematology

## 2016-08-17 ENCOUNTER — Telehealth: Payer: Self-pay | Admitting: Hematology

## 2016-08-17 VITALS — BP 129/70 | HR 81 | Temp 98.3°F | Resp 18 | Ht 70.0 in | Wt 250.6 lb

## 2016-08-17 DIAGNOSIS — D509 Iron deficiency anemia, unspecified: Secondary | ICD-10-CM

## 2016-08-17 DIAGNOSIS — D500 Iron deficiency anemia secondary to blood loss (chronic): Secondary | ICD-10-CM | POA: Diagnosis not present

## 2016-08-17 DIAGNOSIS — K922 Gastrointestinal hemorrhage, unspecified: Secondary | ICD-10-CM | POA: Diagnosis not present

## 2016-08-17 DIAGNOSIS — E538 Deficiency of other specified B group vitamins: Secondary | ICD-10-CM | POA: Diagnosis not present

## 2016-08-17 LAB — COMPREHENSIVE METABOLIC PANEL
ALBUMIN: 3.4 g/dL — AB (ref 3.5–5.0)
ALK PHOS: 89 U/L (ref 40–150)
ALT: 22 U/L (ref 0–55)
AST: 16 U/L (ref 5–34)
Anion Gap: 9 mEq/L (ref 3–11)
BUN: 13.3 mg/dL (ref 7.0–26.0)
CHLORIDE: 104 meq/L (ref 98–109)
CO2: 25 mEq/L (ref 22–29)
Calcium: 9.2 mg/dL (ref 8.4–10.4)
Creatinine: 1 mg/dL (ref 0.7–1.3)
EGFR: 72 mL/min/{1.73_m2} — AB (ref >90)
GLUCOSE: 172 mg/dL — AB (ref 70–140)
POTASSIUM: 4.5 meq/L (ref 3.5–5.1)
SODIUM: 138 meq/L (ref 136–145)
Total Bilirubin: 0.57 mg/dL (ref 0.20–1.20)
Total Protein: 7.4 g/dL (ref 6.4–8.3)

## 2016-08-17 LAB — CBC WITH DIFFERENTIAL/PLATELET
BASO%: 0.9 % (ref 0.0–2.0)
BASOS ABS: 0.1 10*3/uL (ref 0.0–0.1)
EOS ABS: 0.4 10*3/uL (ref 0.0–0.5)
EOS%: 5.4 % (ref 0.0–7.0)
HCT: 38.1 % — ABNORMAL LOW (ref 38.4–49.9)
HEMOGLOBIN: 12.2 g/dL — AB (ref 13.0–17.1)
LYMPH%: 12.4 % — AB (ref 14.0–49.0)
MCH: 25.8 pg — AB (ref 27.2–33.4)
MCHC: 32 g/dL (ref 32.0–36.0)
MCV: 80.4 fL (ref 79.3–98.0)
MONO#: 0.4 10*3/uL (ref 0.1–0.9)
MONO%: 5.5 % (ref 0.0–14.0)
NEUT#: 5.1 10*3/uL (ref 1.5–6.5)
NEUT%: 75.8 % — AB (ref 39.0–75.0)
Platelets: 260 10*3/uL (ref 140–400)
RBC: 4.74 10*6/uL (ref 4.20–5.82)
RDW: 28.2 % — ABNORMAL HIGH (ref 11.0–14.6)
WBC: 6.7 10*3/uL (ref 4.0–10.3)
lymph#: 0.8 10*3/uL — ABNORMAL LOW (ref 0.9–3.3)

## 2016-08-17 LAB — FERRITIN: Ferritin: 159 ng/ml (ref 22–316)

## 2016-08-17 LAB — IRON AND TIBC
%SAT: 25 % (ref 20–55)
Iron: 82 ug/dL (ref 42–163)
TIBC: 324 ug/dL (ref 202–409)
UIBC: 242 ug/dL (ref 117–376)

## 2016-08-17 NOTE — Telephone Encounter (Signed)
Gv pt appt for 09/28/16.

## 2016-08-18 LAB — VITAMIN B12

## 2016-08-20 NOTE — Progress Notes (Signed)
HEMATOLOGY/ONCOLOGY CLINIC NOTE  Date of Service: .08/17/2016  Patient Care Team: [name], MD as PCP - General (Internal Medicine)   Gastroenterology: Dr. 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 Paulita Fujita showed benign polyps. He was recommended repeat colonoscopy in 3-5 years. EGD done 04/18/1215 showed no overt bleeding issues.  Current Treatment IV feraheme as needed to maintain ferritin >100 B complex 1 cap po daily B12 500 g daily orally  HISTORY OF PRESENTING ILLNESS:  (Plz see initial consultation for details regarding initial presentation)  INTERVAL HISTORY  James Cooke is here for f/u regarding his iron deficiency anemia. He notes his energy levels are much improved and his hemoglobin has improved to 12.2 which is the highest he has been for a long time. He notes that he feels quite good. He has not noted any overt melena or hematochezia. Ferritin level to 159 today with no acute indication for additional IV iron. No other acute new symptoms.  MEDICAL HISTORY:  Past Medical History:  Diagnosis Date  . Anemia   . Diabetes mellitus without complication ([place])   . Hyperlipidemia   . Hypertension   . Iron deficiency   . Macular degeneration   . Meningitis     SURGICAL HISTORY: Past Surgical History:  Procedure Laterality Date  . APPENDECTOMY    . COLONOSCOPY    . ESOPHAGOGASTRODUODENOSCOPY    . ESOPHAGOGASTRODUODENOSCOPY (EGD) WITH PROPOFOL N/A 12/10/2015   Procedure: ESOPHAGOGASTRODUODENOSCOPY (EGD) WITH PROPOFOL;  Surgeon: Wilford Corner, MD;  Location: [facility] ENDOSCOPY;  Service: Gastroenterology;  Laterality: N/A;  . GIVENS CAPSULE STUDY N/A 11/10/2015   Procedure: GIVENS CAPSULE STUDY;  Surgeon: Arta Silence, MD;  Location[facility] ENDOSCOPY;  Service: Endoscopy;  Laterality: N/A;  . HERNIA REPAIR    . TONSILLECTOMY      SOCIAL HISTORY: Social History   Social History  . Marital status: Single    Spouse name: N/A  . Number of children: N/A  . Years of education: N/A   Occupational History  . Not on file.   Social History Main Topics  . Smoking status: Never Smoker  . Smokeless tobacco: Never Used  . Alcohol use No  . Drug use: No  . Sexual activity: No   Other Topics Concern  . Not on file   Social History Narrative  . No narrative on file    FAMILY HISTORY: Family History  Problem Relation Age of Onset  . CAD Mother     ALLERGIES:  has No Known Allergies.  MEDICATIONS:  Current Outpatient Prescriptions  Medication Sig Dispense Refill  . ACCU-CHEK AVIVA PLUS test strip     . amLODipine (NORVASC) 5 MG tablet Take 7.5 mg by mouth every evening.     . Ascorbic Acid (VITAMIN C) 1000 MG tablet Take 1,000 mg by mouth daily.    Marland Kitchen atorvastatin (LIPITOR) 80 MG tablet Take 80 mg by mouth daily at 6 PM.     . b complex vitamins capsule Take 1 capsule by mouth daily.    Marland Kitchen CALCIUM-MAGNESIUM-VITAMIN D ER PO Take by mouth.    . docusate sodium (COLACE) 100 MG capsule Take 2 capsules (200 mg total) by mouth at bedtime as needed for mild constipation or moderate constipation. 60 capsule  0  . Fish Oil-Cholecalciferol (OMEGA-3 + VITAMIN D3 PO) Take 1 tablet by mouth daily.    . fluticasone (FLONASE) 50 MCG/ACT nasal spray Place 2 sprays into both nostrils daily as needed for allergies or rhinitis.     Marland Kitchen glimepiride (AMARYL) 2 MG tablet Take 2 mg by mouth daily with breakfast.    . metFORMIN (GLUCOPHAGE) 500 MG tablet Take 500 mg by mouth every evening.     . Multiple Vitamins-Minerals (ICAPS) CAPS Take 1 capsule by mouth daily.     No current facility-administered medications for this visit.    Facility-Administered Medications Ordered in Other Visits  Medication Dose Route  Frequency Provider Last Rate Last Dose  . 0.9 %  sodium chloride infusion   Intravenous Once [name], MD      . acetaminophen (TYLENOL) tablet 650 mg  650 mg Oral Once [name], MD      . diphenhydrAMINE (BENADRYL) capsule 25 mg  25 mg Oral Once [name], MD        REVIEW OF SYSTEMS:    10 Point review of Systems was done is negative except as noted above.  PHYSICAL EXAMINATION: ECOG PERFORMANCE STATUS: 2 - Symptomatic, <50% confined to bed  . Vitals:   08/17/16 1026  Weight: 250 lb 9.6 oz (113.7 kg)  Height: 5\' 10"  (1.778 m)   Filed Weights   08/17/16 1026  Weight: 250 lb 9.6 oz (113.7 kg)   .Body mass index is 35.96 kg/m.  GENERAL:alert, in no acute distress and comfortable SKIN: skin color, texture, turgor are normal, no rashes or significant lesions EYES: normal, conjunctiva are pink and non-injected, sclera clear OROPHARYNX:no exudate, no erythema and lips, buccal mucosa, and tongue normal  NECK: supple, no JVD, thyroid normal size, non-tender, without nodularity LYMPH:  no palpable lymphadenopathy in the cervical, axillary or inguinal LUNGS: clear to auscultation with normal respiratory effort HEART: regular rate & rhythm,  no murmurs and no lower extremity edema ABDOMEN: abdomen soft, non-tender, normoactive bowel sounds , no hepatosplenomegaly  Musculoskeletal: no cyanosis of digits and no clubbing  PSYCH: alert & oriented x 3 with fluent speech NEURO: no focal motor/sensory deficits  LABORATORY DATA:  I have reviewed the data as listed  . CBC Latest Ref Rng & Units 08/17/2016 06/22/2016 04/21/2016  WBC 4.0 - 10.3 10e3/uL 6.7 6.9 6.7  Hemoglobin 13.0 - 17.1 g/dL 12.2(L) 9.1(L) 11.0(L)  Hematocrit 38.4 - 49.9 % 38.1(L) 30.1(L) 35.1(L)  Platelets 140 - 400 10e3/uL 260 271 278   . CBC    Component Value Date/Time   WBC 6.7 08/17/2016 0926   WBC 7.3 12/11/2015 0831   RBC 4.74 08/17/2016 0926   RBC 4.01 (L) 12/11/2015 0831   HGB 12.2 (L) 08/17/2016 0926    HCT 38.1 (L) 08/17/2016 0926   PLT 260 08/17/2016 0926   MCV 80.4 08/17/2016 0926   MCH 25.8 (L) 08/17/2016 0926   MCH 20.2 (L) 12/11/2015 0831   MCHC 32.0 08/17/2016 0926   MCHC 27.9 (L) 12/11/2015 0831   RDW 28.2 (H) 08/17/2016 0926   LYMPHSABS 0.8 (L) 08/17/2016 0926   MONOABS 0.4 08/17/2016 0926   EOSABS 0.4 08/17/2016 0926   BASOSABS 0.1 08/17/2016 0926     . CMP Latest Ref Rng & Units 08/17/2016 06/22/2016 02/25/2016  Glucose 70 - 140 mg/dl 172(H) 159(H) 156(H)  BUN 7.0 - 26.0 mg/dL 13.3 15.6 18.2  Creatinine 0.7 - 1.3 mg/dL 1.0 1.1 1.0  Sodium 136 - 145 mEq/L 138  141 141  Potassium 3.5 - 5.1 mEq/L 4.5 4.6 4.3  Chloride 96 - 112 mmol/L - - -  CO2 22 - 29 mEq/L 25 27 25   Calcium 8.4 - 10.4 mg/dL 9.2 9.5 9.1  Total Protein 6.4 - 8.3 g/dL 7.4 7.5 7.5  Total Bilirubin 0.20 - 1.20 mg/dL 0.57 0.53 0.45  Alkaline Phos 40 - 150 U/L 89 100 89  AST 5 - 34 U/L 16 16 20   ALT 0 - 55 U/L 22 21 27     Lab Results  Component Value Date   FERRITIN 159 08/17/2016     RADIOGRAPHIC STUDIES: I have personally reviewed the radiological images as listed and agreed with the findings in the report. No results found.  RADIOGRAPHIC STUDIES: I have personally reviewed the radiological images as listed and agreed with the findings in the report. No results found.  ASSESSMENT & PLAN:   75 year old with   #1 Microcytic hypochromic anemia. Iron deficiency anemia recurrent and likely due to ongoing GI blood losses. Fecal occult blood x 2 was positive on 10/31/2015. The source of GI bleeding thought to be his Ulcers related to his hiatal hernia . Patient has refused surgical option to address his hiatal hernia with ulceration causing recurrent chronic GI bleeds. Patient's hemoglobin has improved to 11 with resolution of microcytosis.  received IV Feraheme 510 mg x 3 doses on 4/12, 4/19 and 03/17/2016. Again received IV Feraheme 3 doses on 06/30/2016, 07/07/2016, 07/14/2016. Platelet function, PT and  PTT were within normal limits and suggest against the presence of a hemostatic defect. Plan -Patient's hemoglobins are now up to 12.2 and he notes no overt GI bleeding. Ferritin level is at goal of more than 100 being at 159. -No indication for additional IV iron at this time. -Continue oral B12 and B complex replacement to support accelerated erythropoiesis. -Continue PPI twice a day pre-meals. -absolutely avoid NSAIDs and other blood thinners.   -Continued close follow-up with primary care physician and GI.  RTC with Dr Irene Limbo in 6-8 weeks with cbc, cmp and ferritin,iron profile and B12  All of the patients questions were answered to his apparent satisfaction. The patient knows to call the clinic with any problems, questions or concerns.  I spent 15 minutes counseling the patient face to face. The total time spent in the appointment was 20 minutes and more than 50% was on counseling and direct patient cares.    Sullivan Lone MD [place] AAHIVMS [facility] Hematology/Oncology Physician [facility]  (Office):       [phone] (Work cell):  [phone] (Fax):           [phone]

## 2016-09-28 ENCOUNTER — Other Ambulatory Visit: Payer: Self-pay

## 2016-09-28 ENCOUNTER — Ambulatory Visit: Payer: Self-pay | Admitting: Hematology

## 2016-10-04 ENCOUNTER — Ambulatory Visit (HOSPITAL_BASED_OUTPATIENT_CLINIC_OR_DEPARTMENT_OTHER): Payer: Medicare Other | Admitting: Hematology

## 2016-10-04 ENCOUNTER — Other Ambulatory Visit (HOSPITAL_BASED_OUTPATIENT_CLINIC_OR_DEPARTMENT_OTHER): Payer: Medicare Other

## 2016-10-04 ENCOUNTER — Encounter: Payer: Self-pay | Admitting: Hematology

## 2016-10-04 ENCOUNTER — Telehealth: Payer: Self-pay | Admitting: Hematology

## 2016-10-04 VITALS — BP 159/77 | HR 85 | Temp 98.1°F | Resp 18 | Ht 70.0 in | Wt 246.3 lb

## 2016-10-04 DIAGNOSIS — K922 Gastrointestinal hemorrhage, unspecified: Secondary | ICD-10-CM

## 2016-10-04 DIAGNOSIS — E538 Deficiency of other specified B group vitamins: Secondary | ICD-10-CM | POA: Diagnosis not present

## 2016-10-04 DIAGNOSIS — D509 Iron deficiency anemia, unspecified: Secondary | ICD-10-CM | POA: Diagnosis not present

## 2016-10-04 DIAGNOSIS — D500 Iron deficiency anemia secondary to blood loss (chronic): Secondary | ICD-10-CM

## 2016-10-04 LAB — CBC & DIFF AND RETIC
BASO%: 0.5 % (ref 0.0–2.0)
Basophils Absolute: 0 10*3/uL (ref 0.0–0.1)
EOS%: 4.9 % (ref 0.0–7.0)
Eosinophils Absolute: 0.4 10*3/uL (ref 0.0–0.5)
HCT: 41.1 % (ref 38.4–49.9)
HGB: 14 g/dL (ref 13.0–17.1)
IMMATURE RETIC FRACT: 9.1 % (ref 3.00–10.60)
LYMPH#: 0.9 10*3/uL (ref 0.9–3.3)
LYMPH%: 11.3 % — ABNORMAL LOW (ref 14.0–49.0)
MCH: 28.2 pg (ref 27.2–33.4)
MCHC: 34.1 g/dL (ref 32.0–36.0)
MCV: 82.9 fL (ref 79.3–98.0)
MONO#: 0.5 10*3/uL (ref 0.1–0.9)
MONO%: 6.1 % (ref 0.0–14.0)
NEUT%: 77.2 % — ABNORMAL HIGH (ref 39.0–75.0)
NEUTROS ABS: 5.8 10*3/uL (ref 1.5–6.5)
Platelets: 276 10*3/uL (ref 140–400)
RBC: 4.96 10*6/uL (ref 4.20–5.82)
RDW: 18.4 % — AB (ref 11.0–14.6)
RETIC CT ABS: 38.19 10*3/uL (ref 34.80–93.90)
Retic %: 0.77 % — ABNORMAL LOW (ref 0.80–1.80)
WBC: 7.5 10*3/uL (ref 4.0–10.3)

## 2016-10-04 LAB — COMPREHENSIVE METABOLIC PANEL
ALK PHOS: 99 U/L (ref 40–150)
ALT: 26 U/L (ref 0–55)
AST: 19 U/L (ref 5–34)
Albumin: 3.3 g/dL — ABNORMAL LOW (ref 3.5–5.0)
Anion Gap: 11 mEq/L (ref 3–11)
BUN: 12.4 mg/dL (ref 7.0–26.0)
CO2: 26 meq/L (ref 22–29)
CREATININE: 1 mg/dL (ref 0.7–1.3)
Calcium: 9.9 mg/dL (ref 8.4–10.4)
Chloride: 104 mEq/L (ref 98–109)
EGFR: 72 mL/min/{1.73_m2} — AB (ref >90)
Glucose: 99 mg/dl (ref 70–140)
Potassium: 4.4 mEq/L (ref 3.5–5.1)
SODIUM: 141 meq/L (ref 136–145)
TOTAL PROTEIN: 7.6 g/dL (ref 6.4–8.3)
Total Bilirubin: 0.49 mg/dL (ref 0.20–1.20)

## 2016-10-04 NOTE — Telephone Encounter (Signed)
Appointments scheduled per 10/04/16 los. Copy of AVS report and appointment schedule was given to patient, per 10/04/16 los.

## 2016-10-05 LAB — IRON AND TIBC
%SAT: 18 % — ABNORMAL LOW (ref 20–55)
Iron: 62 ug/dL (ref 42–163)
TIBC: 354 ug/dL (ref 202–409)
UIBC: 292 ug/dL (ref 117–376)

## 2016-10-05 LAB — FERRITIN: Ferritin: 54 ng/ml (ref 22–316)

## 2016-10-17 NOTE — Progress Notes (Signed)
HEMATOLOGY/ONCOLOGY CLINIC NOTE  Date of Service: .10/04/2016  Patient Care Team: [name], MD as PCP - General (Internal Medicine)   Gastroenterology: Dr. 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 Paulita Fujita showed benign polyps. He was recommended repeat colonoscopy in 3-5 years. EGD done 04/18/1215 showed no overt bleeding issues.  Current Treatment IV feraheme as needed to maintain ferritin >100 B complex 1 cap po daily B12 500 g daily orally  HISTORY OF PRESENTING ILLNESS:  (Plz see initial consultation for details regarding initial presentation)  INTERVAL HISTORY  James Cooke is here for f/u regarding his iron deficiency anemia. He notes his energy levels are excellent and that he has no other acute new concerns He notes that he feels quite good. He has not noted any overt melena or hematochezia. His hemoglobin is 14 which is the best it has been a long-time. His ferritin levels are continuing to gradually dropped suggesting possible slow blood loss.  We discussed options and timing of additional IV iron replacement. He prefers to do this after the holiday season which is not unreasonable given his blood counts currently.  MEDICAL HISTORY:  Past Medical History:  Diagnosis Date  . Anemia   . Diabetes mellitus without complication ([place])   . Hyperlipidemia   . Hypertension   . Iron deficiency   . Macular degeneration   . Meningitis     SURGICAL HISTORY: Past Surgical History:  Procedure Laterality Date  . APPENDECTOMY    . COLONOSCOPY    . ESOPHAGOGASTRODUODENOSCOPY    . ESOPHAGOGASTRODUODENOSCOPY (EGD) WITH PROPOFOL N/A 12/10/2015   Procedure: ESOPHAGOGASTRODUODENOSCOPY (EGD) WITH PROPOFOL;  Surgeon: Wilford Corner, MD;  Location: [facility] ENDOSCOPY;  Service: Gastroenterology;  Laterality: N/A;  . GIVENS CAPSULE STUDY N/A 11/10/2015   Procedure: GIVENS CAPSULE STUDY;  Surgeon: Arta Silence, MD;  Location[facility] ENDOSCOPY;  Service: Endoscopy;  Laterality: N/A;  . HERNIA REPAIR    . TONSILLECTOMY      SOCIAL HISTORY: Social History   Social History  . Marital status: Single    Spouse name: N/A  . Number of children: N/A  . Years of education: N/A   Occupational History  . Not on file.   Social History Main Topics  . Smoking status: Never Smoker  . Smokeless tobacco: Never Used  . Alcohol use No  . Drug use: No  . Sexual activity: No   Other Topics Concern  . Not on file   Social History Narrative  . No narrative on file    FAMILY HISTORY: Family History  Problem Relation Age of Onset  . CAD Mother     ALLERGIES:  has No Known Allergies.  MEDICATIONS:  Current Outpatient Prescriptions  Medication Sig Dispense Refill  . ACCU-CHEK AVIVA PLUS test strip     . amLODipine (NORVASC) 5 MG tablet Take 7.5 mg by mouth every evening.     . Ascorbic Acid (VITAMIN C) 1000 MG tablet Take 1,000 mg by mouth daily.    Marland Kitchen atorvastatin (LIPITOR) 80 MG tablet Take 80 mg by mouth daily at 6 PM.     . b complex vitamins capsule Take 1 capsule by mouth daily.    Marland Kitchen CALCIUM-MAGNESIUM-VITAMIN D ER PO Take by mouth.    Marland Kitchen  docusate sodium (COLACE) 100 MG capsule Take 2 capsules (200 mg total) by mouth at bedtime as needed for mild constipation or moderate constipation. 60 capsule 0  . Fish Oil-Cholecalciferol (OMEGA-3 + VITAMIN D3 PO) Take 1 tablet by mouth daily.    . fluticasone (FLONASE) 50 MCG/ACT nasal spray Place 2 sprays into both nostrils daily as needed for allergies or rhinitis.     Marland Kitchen glimepiride (AMARYL) 2 MG tablet Take 2 mg by mouth daily with breakfast.    . metFORMIN (GLUCOPHAGE) 500 MG tablet Take 500 mg by mouth every evening.     . Multiple Vitamins-Minerals (ICAPS) CAPS Take 1  capsule by mouth daily.     No current facility-administered medications for this visit.    Facility-Administered Medications Ordered in Other Visits  Medication Dose Route Frequency Provider Last Rate Last Dose  . 0.9 %  sodium chloride infusion   Intravenous Once [name], MD      . acetaminophen (TYLENOL) tablet 650 mg  650 mg Oral Once [name], MD      . diphenhydrAMINE (BENADRYL) capsule 25 mg  25 mg Oral Once [name], MD        REVIEW OF SYSTEMS:    10 Point review of Systems was done is negative except as noted above.  PHYSICAL EXAMINATION: ECOG PERFORMANCE STATUS: 2 - Symptomatic, <50% confined to bed  . Vitals:   10/04/16 1523  Weight: 246 lb 4.8 oz (111.7 kg)  Height: 5\' 10"  (1.778 m)   Filed Weights   10/04/16 1523  Weight: 246 lb 4.8 oz (111.7 kg)   .Body mass index is 35.34 kg/m.  GENERAL:alert, in no acute distress and comfortable SKIN: skin color, texture, turgor are normal, no rashes or significant lesions EYES: normal, conjunctiva are pink and non-injected, sclera clear OROPHARYNX:no exudate, no erythema and lips, buccal mucosa, and tongue normal  NECK: supple, no JVD, thyroid normal size, non-tender, without nodularity LYMPH:  no palpable lymphadenopathy in the cervical, axillary or inguinal LUNGS: clear to auscultation with normal respiratory effort HEART: regular rate & rhythm,  no murmurs and no lower extremity edema ABDOMEN: abdomen soft, non-tender, normoactive bowel sounds , no hepatosplenomegaly  Musculoskeletal: no cyanosis of digits and no clubbing  PSYCH: alert & oriented x 3 with fluent speech NEURO: no focal motor/sensory deficits  LABORATORY DATA:  I have reviewed the data as listed  . CBC Latest Ref Rng & Units 10/04/2016 08/17/2016 06/22/2016  WBC 4.0 - 10.3 10e3/uL 7.5 6.7 6.9  Hemoglobin 13.0 - 17.1 g/dL 14.0 12.2(L) 9.1(L)  Hematocrit 38.4 - 49.9 % 41.1 38.1(L) 30.1(L)  Platelets 140 - 400 10e3/uL 276 260 271   . CBC      Component Value Date/Time   WBC 7.5 10/04/2016 1436   WBC 7.3 12/11/2015 0831   RBC 4.96 10/04/2016 1436   RBC 4.01 (L) 12/11/2015 0831   HGB 14.0 10/04/2016 1436   HCT 41.1 10/04/2016 1436   PLT 276 10/04/2016 1436   MCV 82.9 10/04/2016 1436   MCH 28.2 10/04/2016 1436   MCH 20.2 (L) 12/11/2015 0831   MCHC 34.1 10/04/2016 1436   MCHC 27.9 (L) 12/11/2015 0831   RDW 18.4 (H) 10/04/2016 1436   LYMPHSABS 0.9 10/04/2016 1436   MONOABS 0.5 10/04/2016 1436   EOSABS 0.4 10/04/2016 1436   BASOSABS 0.0 10/04/2016 1436     . CMP Latest Ref Rng & Units 10/04/2016 08/17/2016 06/22/2016  Glucose 70 - 140 mg/dl 99 172(H) 159(H)  BUN  7.0 - 26.0 mg/dL 12.4 13.3 15.6  Creatinine 0.7 - 1.3 mg/dL 1.0 1.0 1.1  Sodium 136 - 145 mEq/L 141 138 141  Potassium 3.5 - 5.1 mEq/L 4.4 4.5 4.6  Chloride 96 - 112 mmol/L - - -  CO2 22 - 29 mEq/L 26 25 27   Calcium 8.4 - 10.4 mg/dL 9.9 9.2 9.5  Total Protein 6.4 - 8.3 g/dL 7.6 7.4 7.5  Total Bilirubin 0.20 - 1.20 mg/dL 0.49 0.57 0.53  Alkaline Phos 40 - 150 U/L 99 89 100  AST 5 - 34 U/L 19 16 16   ALT 0 - 55 U/L 26 22 21    . Lab Results  Component Value Date   IRON 62 10/04/2016   TIBC 354 10/04/2016   IRONPCTSAT 18 (L) 10/04/2016   (Iron and TIBC)  Lab Results  Component Value Date   FERRITIN 54 10/04/2016      RADIOGRAPHIC STUDIES: I have personally reviewed the radiological images as listed and agreed with the findings in the report. No results found.  RADIOGRAPHIC STUDIES: I have personally reviewed the radiological images as listed and agreed with the findings in the report. No results found.  ASSESSMENT & PLAN:   75 year old with   #1 Microcytic hypochromic anemia. Iron deficiency anemia recurrent and likely due to ongoing GI blood losses. Fecal occult blood x 2 was positive on 10/31/2015. The source of GI bleeding thought to be his Ulcers related to his hiatal hernia . Patient has refused surgical option to address his hiatal hernia  with ulceration causing recurrent chronic GI bleeds. Patient's hemoglobin has improved to 11 with resolution of microcytosis.  received IV Feraheme 510 mg x 3 doses on 4/12, 4/19 and 03/17/2016. Again received IV Feraheme 3 doses on 06/30/2016, 07/07/2016, 07/14/2016. Platelet function, PT and PTT were within normal limits and suggest against the presence of a hemostatic defect. Plan -Patient's hemoglobins are now up to 14 and he notes no overt GI bleeding. Ferritin level is gradually dropping back down suggesting slow ongoing losses. -No urgent indication for additional IV iron at this time though he will need additional iron replacement to avoid becoming significantly anemic again. Prefers to have this done after the holidays. We'll placed the orders and have him schedule as per his preference. -Continue oral B12 and B complex replacement to support accelerated erythropoiesis. -Continue PPI twice a day pre-meals. -absolutely avoid NSAIDs and other blood thinners.   -Continued close follow-up with primary care physician and GI.  RTC with Dr Irene Limbo in 8 weeks with cbc, cmp and ferritin,iron profile and B12. We'll schedule additional IV feraheme qweekly x 2 doses as per patients preference.  All of the patients questions were answered to his apparent satisfaction. The patient knows to call the clinic with any problems, questions or concerns.  I spent 15 minutes counseling the patient face to face. The total time spent in the appointment was 20 minutes and more than 50% was on counseling and direct patient cares.    Sullivan Lone MD [place] AAHIVMS [facility] Hematology/Oncology Physician [facility]  (Office):       [phone] (Work cell):  [phone] (Fax):           [phone]

## 2016-10-20 ENCOUNTER — Telehealth: Payer: Self-pay | Admitting: Hematology

## 2016-10-20 NOTE — Telephone Encounter (Signed)
sw pt to confirm 11/24/16 and 12/01/16 appts at 930 am per LOS

## 2016-10-26 DIAGNOSIS — E119 Type 2 diabetes mellitus without complications: Secondary | ICD-10-CM | POA: Diagnosis not present

## 2016-11-01 DIAGNOSIS — E119 Type 2 diabetes mellitus without complications: Secondary | ICD-10-CM | POA: Diagnosis not present

## 2016-11-01 DIAGNOSIS — D649 Anemia, unspecified: Secondary | ICD-10-CM | POA: Diagnosis not present

## 2016-11-05 DIAGNOSIS — D509 Iron deficiency anemia, unspecified: Secondary | ICD-10-CM | POA: Diagnosis not present

## 2016-11-05 DIAGNOSIS — E7800 Pure hypercholesterolemia, unspecified: Secondary | ICD-10-CM | POA: Diagnosis not present

## 2016-11-05 DIAGNOSIS — I10 Essential (primary) hypertension: Secondary | ICD-10-CM | POA: Diagnosis not present

## 2016-11-05 DIAGNOSIS — E119 Type 2 diabetes mellitus without complications: Secondary | ICD-10-CM | POA: Diagnosis not present

## 2016-11-23 ENCOUNTER — Telehealth: Payer: Self-pay | Admitting: *Deleted

## 2016-11-23 NOTE — Telephone Encounter (Signed)
Per patient request I have canceled appt for tomorrow and moved to 1/17

## 2016-11-24 ENCOUNTER — Ambulatory Visit: Payer: Self-pay

## 2016-12-01 ENCOUNTER — Ambulatory Visit (HOSPITAL_BASED_OUTPATIENT_CLINIC_OR_DEPARTMENT_OTHER): Payer: Medicare Other

## 2016-12-01 VITALS — BP 135/55 | HR 79 | Temp 98.3°F | Resp 18

## 2016-12-01 DIAGNOSIS — D500 Iron deficiency anemia secondary to blood loss (chronic): Secondary | ICD-10-CM | POA: Diagnosis not present

## 2016-12-01 MED ORDER — SODIUM CHLORIDE 0.9 % IV SOLN
510.0000 mg | Freq: Once | INTRAVENOUS | Status: AC
  Administered 2016-12-01: 510 mg via INTRAVENOUS
  Filled 2016-12-01: qty 17

## 2016-12-01 MED ORDER — SODIUM CHLORIDE 0.9 % IV SOLN
Freq: Once | INTRAVENOUS | Status: AC
  Administered 2016-12-01: 10:00:00 via INTRAVENOUS

## 2016-12-01 NOTE — Patient Instructions (Signed)

## 2016-12-08 ENCOUNTER — Ambulatory Visit: Payer: Self-pay

## 2016-12-08 ENCOUNTER — Telehealth: Payer: Self-pay | Admitting: Hematology

## 2016-12-08 ENCOUNTER — Ambulatory Visit: Payer: Self-pay | Admitting: Hematology

## 2016-12-08 ENCOUNTER — Telehealth: Payer: Self-pay | Admitting: *Deleted

## 2016-12-08 ENCOUNTER — Other Ambulatory Visit: Payer: Self-pay

## 2016-12-08 NOTE — Telephone Encounter (Signed)
SW pt regarding today's apt's.  Pt plans to reschedule d/t inclement weather.   Scheduling message sent to reschedule apts.  Pt aware.

## 2016-12-08 NOTE — Telephone Encounter (Signed)
Patient called to cancel because of the weather and needs to reschedule an appointment with Dr Irene Limbo and chemo call back number is [phone]

## 2016-12-09 ENCOUNTER — Telehealth: Payer: Self-pay

## 2016-12-09 NOTE — Telephone Encounter (Signed)
Spoke with patient and per desk nurse I can use hold spot due to r/s from weather.  Pt aware of new appts   James Cooke

## 2016-12-16 ENCOUNTER — Ambulatory Visit (HOSPITAL_BASED_OUTPATIENT_CLINIC_OR_DEPARTMENT_OTHER): Payer: Medicare Other | Admitting: Hematology

## 2016-12-16 ENCOUNTER — Telehealth: Payer: Self-pay | Admitting: Hematology

## 2016-12-16 ENCOUNTER — Encounter: Payer: Self-pay | Admitting: Hematology

## 2016-12-16 ENCOUNTER — Ambulatory Visit (HOSPITAL_BASED_OUTPATIENT_CLINIC_OR_DEPARTMENT_OTHER): Payer: Medicare Other

## 2016-12-16 ENCOUNTER — Other Ambulatory Visit (HOSPITAL_BASED_OUTPATIENT_CLINIC_OR_DEPARTMENT_OTHER): Payer: Medicare Other

## 2016-12-16 VITALS — BP 160/66 | HR 87 | Temp 98.3°F | Resp 18 | Ht 70.0 in | Wt 252.0 lb

## 2016-12-16 VITALS — BP 141/79 | HR 78 | Temp 98.1°F | Resp 18

## 2016-12-16 DIAGNOSIS — D509 Iron deficiency anemia, unspecified: Secondary | ICD-10-CM | POA: Diagnosis not present

## 2016-12-16 DIAGNOSIS — D500 Iron deficiency anemia secondary to blood loss (chronic): Secondary | ICD-10-CM | POA: Diagnosis not present

## 2016-12-16 DIAGNOSIS — K922 Gastrointestinal hemorrhage, unspecified: Secondary | ICD-10-CM

## 2016-12-16 DIAGNOSIS — E538 Deficiency of other specified B group vitamins: Secondary | ICD-10-CM | POA: Diagnosis not present

## 2016-12-16 LAB — CBC & DIFF AND RETIC
BASO%: 0.5 % (ref 0.0–2.0)
BASOS ABS: 0 10*3/uL (ref 0.0–0.1)
EOS%: 3.8 % (ref 0.0–7.0)
Eosinophils Absolute: 0.2 10*3/uL (ref 0.0–0.5)
HEMATOCRIT: 36.9 % — AB (ref 38.4–49.9)
HGB: 12 g/dL — ABNORMAL LOW (ref 13.0–17.1)
Immature Retic Fract: 4.3 % (ref 3.00–10.60)
LYMPH%: 10.8 % — ABNORMAL LOW (ref 14.0–49.0)
MCH: 28.2 pg (ref 27.2–33.4)
MCHC: 32.5 g/dL (ref 32.0–36.0)
MCV: 86.6 fL (ref 79.3–98.0)
MONO#: 0.4 10*3/uL (ref 0.1–0.9)
MONO%: 6.6 % (ref 0.0–14.0)
NEUT%: 78.3 % — ABNORMAL HIGH (ref 39.0–75.0)
NEUTROS ABS: 4.5 10*3/uL (ref 1.5–6.5)
Platelets: 211 10*3/uL (ref 140–400)
RBC: 4.26 10*6/uL (ref 4.20–5.82)
RDW: 16.3 % — AB (ref 11.0–14.6)
RETIC %: 1.79 % (ref 0.80–1.80)
Retic Ct Abs: 76.25 10*3/uL (ref 34.80–93.90)
WBC: 5.7 10*3/uL (ref 4.0–10.3)
lymph#: 0.6 10*3/uL — ABNORMAL LOW (ref 0.9–3.3)

## 2016-12-16 LAB — COMPREHENSIVE METABOLIC PANEL
ALT: 21 U/L (ref 0–55)
AST: 17 U/L (ref 5–34)
Albumin: 3.5 g/dL (ref 3.5–5.0)
Alkaline Phosphatase: 89 U/L (ref 40–150)
Anion Gap: 9 mEq/L (ref 3–11)
BUN: 18.8 mg/dL (ref 7.0–26.0)
CALCIUM: 9.1 mg/dL (ref 8.4–10.4)
CO2: 22 mEq/L (ref 22–29)
Chloride: 109 mEq/L (ref 98–109)
Creatinine: 1 mg/dL (ref 0.7–1.3)
EGFR: 78 mL/min/{1.73_m2} — AB (ref >90)
Glucose: 106 mg/dl (ref 70–140)
POTASSIUM: 4.1 meq/L (ref 3.5–5.1)
Sodium: 140 mEq/L (ref 136–145)
Total Bilirubin: 0.48 mg/dL (ref 0.20–1.20)
Total Protein: 7.2 g/dL (ref 6.4–8.3)

## 2016-12-16 LAB — FERRITIN: FERRITIN: 207 ng/mL (ref 22–316)

## 2016-12-16 LAB — IRON AND TIBC
%SAT: 23 % (ref 20–55)
IRON: 82 ug/dL (ref 42–163)
TIBC: 363 ug/dL (ref 202–409)
UIBC: 280 ug/dL (ref 117–376)

## 2016-12-16 MED ORDER — SODIUM CHLORIDE 0.9 % IV SOLN
510.0000 mg | Freq: Once | INTRAVENOUS | Status: AC
  Administered 2016-12-16: 510 mg via INTRAVENOUS
  Filled 2016-12-16: qty 17

## 2016-12-16 MED ORDER — SODIUM CHLORIDE 0.9 % IV SOLN
INTRAVENOUS | Status: DC
  Administered 2016-12-16: 11:00:00 via INTRAVENOUS

## 2016-12-16 NOTE — Patient Instructions (Signed)
Ferumoxytol injection What is this medicine? FERUMOXYTOL is an iron complex. Iron is used to make healthy red blood cells, which carry oxygen and nutrients throughout the body. This medicine is used to treat iron deficiency anemia in people with chronic kidney disease. COMMON BRAND NAME(S): Feraheme What should I tell my health care provider before I take this medicine? They need to know if you have any of these conditions: -anemia not caused by low iron levels -high levels of iron in the blood -magnetic resonance imaging (MRI) test scheduled -an unusual or allergic reaction to iron, other medicines, foods, dyes, or preservatives -pregnant or trying to get pregnant -breast-feeding How should I use this medicine? This medicine is for injection into a vein. It is given by a health care professional in a hospital or clinic setting. Talk to your pediatrician regarding the use of this medicine in children. Special care may be needed. What if I miss a dose? It is important not to miss your dose. Call your doctor or health care professional if you are unable to keep an appointment. What may interact with this medicine? This medicine may interact with the following medications: -other iron products What should I watch for while using this medicine? Visit your doctor or healthcare professional regularly. Tell your doctor or healthcare professional if your symptoms do not start to get better or if they get worse. You may need blood work done while you are taking this medicine. You may need to follow a special diet. Talk to your doctor. Foods that contain iron include: whole grains/cereals, dried fruits, beans, or peas, leafy green vegetables, and organ meats (liver, kidney). What side effects may I notice from receiving this medicine? Side effects that you should report to your doctor or health care professional as soon as possible: -allergic reactions like skin rash, itching or hives, swelling of the  face, lips, or tongue -breathing problems -changes in blood pressure -feeling faint or lightheaded, falls -fever or chills -flushing, sweating, or hot feelings -swelling of the ankles or feet Side effects that usually do not require medical attention (report to your doctor or health care professional if they continue or are bothersome): -diarrhea -headache -nausea, vomiting -stomach pain Where should I keep my medicine? This drug is given in a hospital or clinic and will not be stored at home.  2017 Elsevier/Gold Standard (2015-12-11 12:41:49)  

## 2016-12-16 NOTE — Telephone Encounter (Signed)
Appointment scheduled per 12/16/16 los. Patient was given a copy of the AVS report and appointment schedule per 12/16/16 los. °

## 2016-12-16 NOTE — Progress Notes (Signed)
HEMATOLOGY/ONCOLOGY CLINIC NOTE  Date of Service: .12/16/2016  Patient Care Team: [name], MD as PCP - General (Internal Medicine)   Gastroenterology: Dr. 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 Paulita Fujita showed benign polyps. He was recommended repeat colonoscopy in 3-5 years. EGD done 04/18/1215 showed no overt bleeding issues.  Current Treatment IV feraheme as needed to maintain ferritin >100 B complex 1 cap po daily B12 500 g daily orally  HISTORY OF PRESENTING ILLNESS:  (Plz see initial consultation for details regarding initial presentation)  INTERVAL HISTORY  James Cooke is here for f/u regarding his iron deficiency anemia. He notes he had a good holiday season. Missed his IV Iron infusion on 11/24/2016 but received it on 12/01/2016 and is scheduled for today. Hgb stable today. Ferritin with PCP had trended down to 37 prior to his recent IV iron. No overt noticeable GI bleeding. No overt hematochezia or melena.   MEDICAL HISTORY:  Past Medical History:  Diagnosis Date  . Anemia   . Diabetes mellitus without complication ([place])   . Hyperlipidemia   . Hypertension   . Iron deficiency   . Macular degeneration   . Meningitis     SURGICAL HISTORY: Past Surgical History:  Procedure Laterality Date  . APPENDECTOMY    . COLONOSCOPY    . ESOPHAGOGASTRODUODENOSCOPY    . ESOPHAGOGASTRODUODENOSCOPY (EGD) WITH PROPOFOL N/A 12/10/2015   Procedure: ESOPHAGOGASTRODUODENOSCOPY (EGD) WITH PROPOFOL;  Surgeon: Wilford Corner, MD;  Location: [facility] ENDOSCOPY;  Service: Gastroenterology;  Laterality: N/A;  . GIVENS CAPSULE STUDY N/A 11/10/2015   Procedure: GIVENS CAPSULE STUDY;  Surgeon: Arta Silence, MD;  Location: [facility] ENDOSCOPY;   Service: Endoscopy;  Laterality: N/A;  . HERNIA REPAIR    . TONSILLECTOMY      SOCIAL HISTORY: Social History   Social History  . Marital status: Single    Spouse name: N/A  . Number of children: N/A  . Years of education: N/A   Occupational History  . Not on file.   Social History Main Topics  . Smoking status: Never Smoker  . Smokeless tobacco: Never Used  . Alcohol use No  . Drug use: No  . Sexual activity: No   Other Topics Concern  . Not on file   Social History Narrative  . No narrative on file    FAMILY HISTORY: Family History  Problem Relation Age of Onset  . CAD Mother     ALLERGIES:  has No Known Allergies.  MEDICATIONS:  Current Outpatient Prescriptions  Medication Sig Dispense Refill  . ACCU-CHEK AVIVA PLUS test strip     . amLODipine (NORVASC) 5 MG tablet Take 7.5 mg by mouth every evening.     . Ascorbic Acid (VITAMIN C) 1000 MG tablet Take 1,000 mg by mouth daily.    Marland Kitchen atorvastatin (LIPITOR) 80 MG tablet Take 80 mg by mouth daily at 6 PM.     . b complex vitamins capsule Take 1 capsule by mouth daily.    Marland Kitchen CALCIUM-MAGNESIUM-VITAMIN D ER PO Take by mouth.    . docusate sodium (COLACE) 100 MG capsule Take 2 capsules (200 mg total) by mouth at bedtime as needed for mild constipation or moderate constipation. 60 capsule 0  . Fish Oil-Cholecalciferol (OMEGA-3 +  VITAMIN D3 PO) Take 1 tablet by mouth daily.    . fluticasone (FLONASE) 50 MCG/ACT nasal spray Place 2 sprays into both nostrils daily as needed for allergies or rhinitis.     Marland Kitchen glimepiride (AMARYL) 2 MG tablet Take 2 mg by mouth daily with breakfast.    . metFORMIN (GLUCOPHAGE) 500 MG tablet Take 500 mg by mouth every evening.     . Multiple Vitamins-Minerals (ICAPS) CAPS Take 1 capsule by mouth daily.     No current facility-administered medications for this visit.    Facility-Administered Medications Ordered in Other Visits  Medication Dose Route Frequency Provider Last Rate Last Dose  .  0.9 %  sodium chloride infusion   Intravenous Once [name], MD      . acetaminophen (TYLENOL) tablet 650 mg  650 mg Oral Once [name], MD      . diphenhydrAMINE (BENADRYL) capsule 25 mg  25 mg Oral Once [name], MD        REVIEW OF SYSTEMS:    10 Point review of Systems was done is negative except as noted above.  PHYSICAL EXAMINATION: ECOG PERFORMANCE STATUS: 2 - Symptomatic, <50% confined to bed  . Vitals:   12/16/16 0903  Weight: 252 lb (114.3 kg)  Height: 5\' 10"  (1.778 m)   Filed Weights   12/16/16 0903  Weight: 252 lb (114.3 kg)   .Body mass index is 36.16 kg/m.  GENERAL:alert, in no acute distress and comfortable SKIN: skin color, texture, turgor are normal, no rashes or significant lesions EYES: normal, conjunctiva are pink and non-injected, sclera clear OROPHARYNX:no exudate, no erythema and lips, buccal mucosa, and tongue normal  NECK: supple, no JVD, thyroid normal size, non-tender, without nodularity LYMPH:  no palpable lymphadenopathy in the cervical, axillary or inguinal LUNGS: clear to auscultation with normal respiratory effort HEART: regular rate & rhythm,  no murmurs and no lower extremity edema ABDOMEN: abdomen soft, non-tender, normoactive bowel sounds , no hepatosplenomegaly  Musculoskeletal: no cyanosis of digits and no clubbing  PSYCH: alert & oriented x 3 with fluent speech NEURO: no focal motor/sensory deficits  LABORATORY DATA:  I have reviewed the data as listed  . CBC Latest Ref Rng & Units 12/16/2016 10/04/2016 08/17/2016  WBC 4.0 - 10.3 10e3/uL 5.7 7.5 6.7  Hemoglobin 13.0 - 17.1 g/dL 12.0(L) 14.0 12.2(L)  Hematocrit 38.4 - 49.9 % 36.9(L) 41.1 38.1(L)  Platelets 140 - 400 10e3/uL 211 276 260   . CBC    Component Value Date/Time   WBC 5.7 12/16/2016 0831   WBC 7.3 12/11/2015 0831   RBC 4.26 12/16/2016 0831   RBC 4.01 (L) 12/11/2015 0831   HGB 12.0 (L) 12/16/2016 0831   HCT 36.9 (L) 12/16/2016 0831   PLT 211 12/16/2016 0831    MCV 86.6 12/16/2016 0831   MCH 28.2 12/16/2016 0831   MCH 20.2 (L) 12/11/2015 0831   MCHC 32.5 12/16/2016 0831   MCHC 27.9 (L) 12/11/2015 0831   RDW 16.3 (H) 12/16/2016 0831   LYMPHSABS 0.6 (L) 12/16/2016 0831   MONOABS 0.4 12/16/2016 0831   EOSABS 0.2 12/16/2016 0831   BASOSABS 0.0 12/16/2016 0831     . CMP Latest Ref Rng & Units 12/16/2016 10/04/2016 08/17/2016  Glucose 70 - 140 mg/dl 106 99 172(H)  BUN 7.0 - 26.0 mg/dL 18.8 12.4 13.3  Creatinine 0.7 - 1.3 mg/dL 1.0 1.0 1.0  Sodium 136 - 145 mEq/L 140 141 138  Potassium 3.5 - 5.1 mEq/L 4.1 4.4 4.5  Chloride 96 - 112 mmol/L - - -  CO2 22 - 29 mEq/L 22 26 25   Calcium 8.4 - 10.4 mg/dL 9.1 9.9 9.2  Total Protein 6.4 - 8.3 g/dL 7.2 7.6 7.4  Total Bilirubin 0.20 - 1.20 mg/dL 0.48 0.49 0.57  Alkaline Phos 40 - 150 U/L 89 99 89  AST 5 - 34 U/L 17 19 16   ALT 0 - 55 U/L 21 26 22     RADIOGRAPHIC STUDIES: I have personally reviewed the radiological images as listed and agreed with the findings in the report. No results found.  RADIOGRAPHIC STUDIES: I have personally reviewed the radiological images as listed and agreed with the findings in the report. No results found.  ASSESSMENT & PLAN:   76 year old with   #1 Microcytic hypochromic anemia. Iron deficiency anemia recurrent and likely due to ongoing GI blood losses. Fecal occult blood x 2 was positive on 10/31/2015. The source of GI bleeding thought to be his Ulcers related to his hiatal hernia . Patient has refused surgical option to address his hiatal hernia with ulceration causing recurrent chronic GI bleeds. Patient's hemoglobin has improved to 11 with resolution of microcytosis.  received IV Feraheme 510 mg x 3 doses on 4/12, 4/19 and 03/17/2016. Again received IV Feraheme 3 doses on 06/30/2016, 07/07/2016, 07/14/2016. Platelet function, PT and PTT were within normal limits and suggest against the presence of a hemostatic defect. Plan -Hgb a little lower at 12. -receiving IV  feraheme today and received it on 12/01/2016. Will continue to replace this as needed to maintain ferritin levels >100 and iron saturation of >30 - Ferritin level is gradually dropping back down suggesting slow ongoing losses. -Continue oral B12 and B complex replacement to support accelerated erythropoiesis. -Continue PPI twice a day pre-meals. -absolutely avoid NSAIDs and other blood thinners.   -Continued close follow-up with primary care physician and GI.  RTC with Dr Irene Limbo in 8 weeks with cbc, cmp and ferritin,iron profile    All of the patients questions were answered to his apparent satisfaction. The patient knows to call the clinic with any problems, questions or concerns.  I spent 15 minutes counseling the patient face to face. The total time spent in the appointment was 20 minutes and more than 50% was on counseling and direct patient cares.    Sullivan Lone MD [place] AAHIVMS [facility] Hematology/Oncology Physician [facility]  (Office):       [phone] (Work cell):  [phone] (Fax):           [phone]

## 2016-12-17 LAB — VITAMIN B12: VITAMIN B 12: 815 pg/mL (ref 232–1245)

## 2017-01-19 DIAGNOSIS — I10 Essential (primary) hypertension: Secondary | ICD-10-CM | POA: Diagnosis not present

## 2017-01-19 DIAGNOSIS — E119 Type 2 diabetes mellitus without complications: Secondary | ICD-10-CM | POA: Diagnosis not present

## 2017-01-19 DIAGNOSIS — Z0000 Encounter for general adult medical examination without abnormal findings: Secondary | ICD-10-CM | POA: Diagnosis not present

## 2017-01-19 DIAGNOSIS — D509 Iron deficiency anemia, unspecified: Secondary | ICD-10-CM | POA: Diagnosis not present

## 2017-01-24 DIAGNOSIS — I10 Essential (primary) hypertension: Secondary | ICD-10-CM | POA: Diagnosis not present

## 2017-01-24 DIAGNOSIS — E7800 Pure hypercholesterolemia, unspecified: Secondary | ICD-10-CM | POA: Diagnosis not present

## 2017-01-24 DIAGNOSIS — E119 Type 2 diabetes mellitus without complications: Secondary | ICD-10-CM | POA: Diagnosis not present

## 2017-01-24 DIAGNOSIS — Z0000 Encounter for general adult medical examination without abnormal findings: Secondary | ICD-10-CM | POA: Diagnosis not present

## 2017-02-07 ENCOUNTER — Telehealth: Payer: Self-pay | Admitting: Hematology

## 2017-02-07 NOTE — Telephone Encounter (Signed)
GK covering AP - moved 3/22 appointments to 3/29. Spoke with patient.

## 2017-02-10 ENCOUNTER — Ambulatory Visit: Payer: Self-pay | Admitting: Hematology

## 2017-02-10 ENCOUNTER — Other Ambulatory Visit: Payer: Self-pay

## 2017-02-12 ENCOUNTER — Emergency Department (HOSPITAL_COMMUNITY)
Admission: EM | Admit: 2017-02-12 | Discharge: 2017-02-12 | Disposition: A | Discharge status: Home / Self Care | Payer: Medicare Other | Attending: Emergency Medicine | Admitting: Emergency Medicine

## 2017-02-12 DIAGNOSIS — E119 Type 2 diabetes mellitus without complications: Secondary | ICD-10-CM | POA: Insufficient documentation

## 2017-02-12 DIAGNOSIS — R197 Diarrhea, unspecified: Secondary | ICD-10-CM | POA: Diagnosis not present

## 2017-02-12 DIAGNOSIS — Z7984 Long term (current) use of oral hypoglycemic drugs: Secondary | ICD-10-CM | POA: Diagnosis not present

## 2017-02-12 DIAGNOSIS — I10 Essential (primary) hypertension: Secondary | ICD-10-CM | POA: Insufficient documentation

## 2017-02-12 DIAGNOSIS — Z79899 Other long term (current) drug therapy: Secondary | ICD-10-CM | POA: Insufficient documentation

## 2017-02-12 LAB — BASIC METABOLIC PANEL
Anion gap: 8 (ref 5–15)
BUN: 27 mg/dL — AB (ref 6–20)
CO2: 23 mmol/L (ref 22–32)
CREATININE: 1.17 mg/dL (ref 0.61–1.24)
Calcium: 9.2 mg/dL (ref 8.9–10.3)
Chloride: 108 mmol/L (ref 101–111)
GFR calc Af Amer: >60 mL/min (ref >60)
GFR calc non Af Amer: 59 mL/min — ABNORMAL LOW (ref >60)
Glucose, Bld: 151 mg/dL — ABNORMAL HIGH (ref 65–99)
Potassium: 4.3 mmol/L (ref 3.5–5.1)
SODIUM: 139 mmol/L (ref 135–145)

## 2017-02-12 LAB — CBC
HCT: 43.1 % (ref 39.0–52.0)
Hemoglobin: 14.3 g/dL (ref 13.0–17.0)
MCH: 29 pg (ref 26.0–34.0)
MCHC: 33.2 g/dL (ref 30.0–36.0)
MCV: 87.4 fL (ref 78.0–100.0)
PLATELETS: 225 10*3/uL (ref 150–400)
RBC: 4.93 MIL/uL (ref 4.22–5.81)
RDW: 15.2 % (ref 11.5–15.5)
WBC: 14.5 10*3/uL — ABNORMAL HIGH (ref 4.0–10.5)

## 2017-02-12 LAB — I-STAT CHEM 8, ED
BUN: 28 mg/dL — AB (ref 6–20)
CALCIUM ION: 1.08 mmol/L — AB (ref 1.15–1.40)
CREATININE: 1.1 mg/dL (ref 0.61–1.24)
Chloride: 108 mmol/L (ref 101–111)
GLUCOSE: 152 mg/dL — AB (ref 65–99)
HCT: 44 % (ref 39.0–52.0)
Hemoglobin: 15 g/dL (ref 13.0–17.0)
Potassium: 4.6 mmol/L (ref 3.5–5.1)
SODIUM: 140 mmol/L (ref 135–145)
TCO2: 22 mmol/L (ref 0–100)

## 2017-02-12 LAB — MAGNESIUM: MAGNESIUM: 2 mg/dL (ref 1.7–2.4)

## 2017-02-12 MED ORDER — SODIUM CHLORIDE 0.9 % IV BOLUS (SEPSIS)
1000.0000 mL | Freq: Once | INTRAVENOUS | Status: AC
  Administered 2017-02-12: 1000 mL via INTRAVENOUS

## 2017-02-12 NOTE — Discharge Instructions (Signed)
Follow-up with your primary care doctor in 2 days if no improvement in symptoms.  Make sure to drink plenty of clear fluids to stay hydrated.   Eat bland foods such as Bananas, Rice, Applesauce, Toast until diarrhea improves. And then increase as tolerated.   Return to the ER for persistent diarrhea that does improve, blood in steal, weakness, dizziness or any other concerning symptoms.

## 2017-02-12 NOTE — ED Triage Notes (Signed)
Per report:  Since 0500 today pt has been having diarrhea that he hasn't been able to control.  Medic felt an irr pulse but couldn't run EKG d/t being a basic unit.  VS: 154/82, 92, 18, 98%RA.  CBG 160 (hadn't taken metformin yet this am).  AXOX4 lives alone.

## 2017-02-12 NOTE — ED Notes (Signed)
Bed: DS89 Expected date:  Expected time:  Means of arrival:  Comments: 76 yo Diarrhea

## 2017-02-12 NOTE — ED Provider Notes (Signed)
[facility] DEPT Provider Note   CSN: [id number] Arrival date & time: 02/12/17  0945     History   Chief Complaint Chief Complaint  Patient presents with  . Diarrhea    HPI James Cooke is a 76 y.o. male who presents via EMS with diarrhea that began at 0500 this AM when he woke up. Patient states that symptoms are constant and he has had numerous episodes of diarrhea since onset. He denies any melena or hematochezia. He reports eating shrimp last night, but denies any other abnormal food. He has not recently traveled. He has not taken any medications for the symptoms. Patient has a history of chronic GI bleed secondary to chronic NSAID use. He has been evaluated by GI. His last endoscopy and colonoscopy was in 2016. He had evidence of benign polyps on colonoscopy, which were not removed at that time. He denies any fever, abdominal pain, nausea/vomiting. He denies any recent antibiotic use.    The history is provided by the patient.    Past Medical History:  Diagnosis Date  . Anemia   . Diabetes mellitus without complication ([place])   . Hyperlipidemia   . Hypertension   . Iron deficiency   . Macular degeneration   . Meningitis     Patient Active Problem List   Diagnosis Date Noted  . B12 deficiency 04/21/2016  . Chronic GI bleeding 12/08/2015  . Iron deficiency anemia due to chronic blood loss 08/12/2015  . Anemia 12/19/2014    Past Surgical History:  Procedure Laterality Date  . APPENDECTOMY    . COLONOSCOPY    . ESOPHAGOGASTRODUODENOSCOPY    . ESOPHAGOGASTRODUODENOSCOPY (EGD) WITH PROPOFOL N/A 12/10/2015   Procedure: ESOPHAGOGASTRODUODENOSCOPY (EGD) WITH PROPOFOL;  Surgeon: Wilford Corner, MD;  Location: [facility] ENDOSCOPY;  Service: Gastroenterology;  Laterality: N/A;  . GIVENS CAPSULE STUDY N/A 11/10/2015   Procedure: GIVENS CAPSULE STUDY;  Surgeon: Arta Silence, MD;  Location: [facility] ENDOSCOPY;  Service: Endoscopy;  Laterality: N/A;  . HERNIA REPAIR    .  TONSILLECTOMY         Home Medications    Prior to Admission medications   Medication Sig Start Date End Date Taking? Authorizing Provider  amLODipine (NORVASC) 5 MG tablet Take 5 mg by mouth every evening.    Yes Historical Provider, MD  atorvastatin (LIPITOR) 80 MG tablet Take 80 mg by mouth daily at 6 PM.    Yes Historical Provider, MD  b complex vitamins capsule Take 1 capsule by mouth daily. 01/28/16  Yes Brunetta Genera, MD  B Complex-C-E-Zn (STRESS-600/ZINC PO) Take 1 tablet by mouth daily.   Yes Historical Provider, MD  CALCIUM-MAGNESIUM-VITAMIN D ER PO Take by mouth.   Yes Historical Provider, MD  docusate sodium (COLACE) 100 MG capsule Take 2 capsules (200 mg total) by mouth at bedtime as needed for mild constipation or moderate constipation. 12/08/15  Yes Brunetta Genera, MD  Fish Oil-Cholecalciferol (OMEGA-3 + VITAMIN D3 PO) Take 1 tablet by mouth daily.   Yes Historical Provider, MD  glimepiride (AMARYL) 2 MG tablet Take 2 mg by mouth daily with breakfast.   Yes Historical Provider, MD  metFORMIN (GLUCOPHAGE) 500 MG tablet Take 500 mg by mouth every evening.    Yes Historical Provider, MD  Multiple Vitamins-Minerals (ICAPS) CAPS Take 1 capsule by mouth daily.   Yes Historical Provider, MD  omeprazole (PRILOSEC) 20 MG capsule Take 20 mg by mouth as needed.   Yes Historical Provider, MD  pioglitazone (ACTOS) 15 MG tablet  12/04/16  Yes Historical Provider, MD  [place] test strip  07/06/15   Historical Provider, MD    Family History Family History  Problem Relation Age of Onset  . CAD Mother     Social History Social History  Substance Use Topics  . Smoking status: Never Smoker  . Smokeless tobacco: Never Used  . Alcohol use No     Allergies   Patient has no known allergies.   Review of Systems Review of Systems  Constitutional: Negative for chills and fever.  HENT: Negative for congestion, rhinorrhea and sore throat.   Respiratory: Negative  for cough and shortness of breath.   Cardiovascular: Negative for chest pain, palpitations and leg swelling.  Gastrointestinal: Positive for diarrhea. Negative for abdominal pain, anal bleeding, blood in stool, nausea and vomiting.  Genitourinary: Negative for dysuria and hematuria.  Musculoskeletal: Negative for back pain.  Skin: Negative for rash.  Neurological: Negative for dizziness, weakness and headaches.  Psychiatric/Behavioral: Negative for confusion.  All other systems reviewed and are negative.    Physical Exam Updated Vital Signs BP (!) 145/75 (BP Location: Left Arm)   Pulse 100 Comment: he was up getting dressed just prior to VS being taken  Temp 97.3 F (36.3 C) (Oral)   Resp 20   Ht 5\' 10"  (1.778 m)   Wt 113.4 kg   SpO2 98%   BMI 35.87 kg/m   Physical Exam  Constitutional: He is oriented to person, place, and time. He appears well-developed and well-nourished.  HENT:  Head: Normocephalic and atraumatic.  Mouth/Throat: Oropharynx is clear and moist. Mucous membranes are dry.  Eyes: Conjunctivae, EOM and lids are normal. Pupils are equal, round, and reactive to light.  Neck: Full passive range of motion without pain.  Cardiovascular: Normal rate, normal heart sounds and intact distal pulses.  An irregular rhythm present. Exam reveals no gallop and no friction rub.   No murmur heard. Pulmonary/Chest: Effort normal and breath sounds normal.  Abdominal: Soft. Normal appearance. There is no tenderness. There is no rigidity and no guarding.  Genitourinary: Rectal exam shows no tenderness and anal tone normal.  Genitourinary Comments: Rectal exam done with ED tech and nurse as chaperone. No gross blood on digital exam  Musculoskeletal: Normal range of motion.  Neurological: He is alert and oriented to person, place, and time.  Skin: Skin is warm and dry. Capillary refill takes less than 2 seconds.  Psychiatric: He has a normal mood and affect. His speech is normal.    Nursing note and vitals reviewed.    ED Treatments / Results  Labs (all labs ordered are listed, but only abnormal results are displayed) Labs Reviewed  CBC - Abnormal; Notable for the following:       Result Value   WBC 14.5 (*)    All other components within normal limits  BASIC METABOLIC PANEL - Abnormal; Notable for the following:    Glucose, Bld 151 (*)    BUN 27 (*)    GFR calc non Af Amer 59 (*)    All other components within normal limits  I-STAT CHEM 8, ED - Abnormal; Notable for the following:    BUN 28 (*)    Glucose, Bld 152 (*)    Calcium, Ion 1.08 (*)    All other components within normal limits  MAGNESIUM    EKG  EKG Interpretation  Date/Time:  Saturday February 12 2017 10:12:38 EDT Ventricular Rate:  86 PR Interval:  QRS Duration: 92 QT Interval:  341 QTC Calculation: 408 R Axis:   11 Text Interpretation:  Sinus rhythm No acute changes No old tracing to compare Nonspecific ST and T wave abnormality Confirmed by Kathrynn Humble, MD, Thelma Comp [phone]) on 02/12/2017 10:42:09 AM       Radiology No results found.  Procedures Procedures (including critical care time)  Medications Ordered in ED Medications  sodium chloride 0.9 % bolus 1,000 mL (0 mLs Intravenous Stopped 02/12/17 1222)     Initial Impression / Assessment and Plan / ED Course  I have reviewed the triage vital signs and the nursing notes.  Pertinent labs & imaging results that were available during my care of the patient were reviewed by me and considered in my medical decision making (see chart for details).     76 yo M presents with diarrhea that began at 0500 this AM. No recent travel or antibiotic use. Ate some shrimp last night but no other abnormal food. Does have a history of chronic anemia associated with prior GI bleed secondary to NSAID use. History/physical and duration of symptoms likely consistent with acute infectious diarrhea. Low suspicion for lower GI bleed given exam and lack of  melena or hematochezia. Slight irregularity seen on cardiac monitor. Initial EKG rhythm strip with premature atrial contractions. Will check formal EKG for further evaluation.  Will give 1L NSL fluid bolus for resuscitation. Plan to check CBC, BMP given past medical history of anemia.    Labs reviewed. CBC with mild leukocytosis. Hgb/Hct stable. Symptoms likely a result of acute infectious etiology. EKG with premature atrial contractions. No prior EKG for review. Will have patient follow-up with his PCP. Stable for discharge. Discussed results with patient. Encouraged him to make sure he's adequately hydrating with clear fluids. Encouraged bland diet with increase as tolerated. Instructed him to call his PCP and arrange for follow-up in 2 days regarding his ED visit. Patient expresses understanding and agreement to plan.     Final Clinical Impressions(s) / ED Diagnoses   Final diagnoses:  Diarrhea, unspecified type    New Prescriptions Discharge Medication List as of 02/12/2017 11:51 AM       Orson Aloe, PA 02/12/17 1613    Varney Biles, MD 02/13/17 1021

## 2017-02-17 ENCOUNTER — Other Ambulatory Visit (HOSPITAL_BASED_OUTPATIENT_CLINIC_OR_DEPARTMENT_OTHER): Payer: Medicare Other

## 2017-02-17 ENCOUNTER — Ambulatory Visit (HOSPITAL_BASED_OUTPATIENT_CLINIC_OR_DEPARTMENT_OTHER): Payer: Medicare Other | Admitting: Hematology

## 2017-02-17 ENCOUNTER — Telehealth: Payer: Self-pay | Admitting: Hematology

## 2017-02-17 VITALS — BP 127/58 | HR 78 | Temp 98.4°F | Resp 18 | Ht 70.0 in | Wt 248.3 lb

## 2017-02-17 DIAGNOSIS — D500 Iron deficiency anemia secondary to blood loss (chronic): Secondary | ICD-10-CM

## 2017-02-17 DIAGNOSIS — K469 Unspecified abdominal hernia without obstruction or gangrene: Secondary | ICD-10-CM

## 2017-02-17 DIAGNOSIS — K922 Gastrointestinal hemorrhage, unspecified: Secondary | ICD-10-CM | POA: Diagnosis not present

## 2017-02-17 DIAGNOSIS — D509 Iron deficiency anemia, unspecified: Secondary | ICD-10-CM

## 2017-02-17 LAB — COMPREHENSIVE METABOLIC PANEL
ALBUMIN: 3.7 g/dL (ref 3.5–5.0)
ALK PHOS: 83 U/L (ref 40–150)
ALT: 31 U/L (ref 0–55)
AST: 23 U/L (ref 5–34)
Anion Gap: 8 mEq/L (ref 3–11)
BUN: 15.2 mg/dL (ref 7.0–26.0)
CO2: 23 mEq/L (ref 22–29)
CREATININE: 1 mg/dL (ref 0.7–1.3)
Calcium: 9.3 mg/dL (ref 8.4–10.4)
Chloride: 109 mEq/L (ref 98–109)
EGFR: 70 mL/min/{1.73_m2} — ABNORMAL LOW (ref >90)
GLUCOSE: 90 mg/dL (ref 70–140)
Potassium: 4.1 mEq/L (ref 3.5–5.1)
SODIUM: 140 meq/L (ref 136–145)
Total Bilirubin: 0.49 mg/dL (ref 0.20–1.20)
Total Protein: 7.3 g/dL (ref 6.4–8.3)

## 2017-02-17 LAB — CBC & DIFF AND RETIC
BASO%: 0.6 % (ref 0.0–2.0)
BASOS ABS: 0 10*3/uL (ref 0.0–0.1)
EOS ABS: 0.2 10*3/uL (ref 0.0–0.5)
EOS%: 5 % (ref 0.0–7.0)
HEMATOCRIT: 39.7 % (ref 38.4–49.9)
HEMOGLOBIN: 13.3 g/dL (ref 13.0–17.1)
IMMATURE RETIC FRACT: 1.9 % — AB (ref 3.00–10.60)
LYMPH#: 0.4 10*3/uL — AB (ref 0.9–3.3)
LYMPH%: 9.3 % — ABNORMAL LOW (ref 14.0–49.0)
MCH: 28.9 pg (ref 27.2–33.4)
MCHC: 33.5 g/dL (ref 32.0–36.0)
MCV: 86.3 fL (ref 79.3–98.0)
MONO#: 0.6 10*3/uL (ref 0.1–0.9)
MONO%: 13 % (ref 0.0–14.0)
NEUT#: 3.3 10*3/uL (ref 1.5–6.5)
NEUT%: 72.1 % (ref 39.0–75.0)
Platelets: 201 10*3/uL (ref 140–400)
RBC: 4.6 10*6/uL (ref 4.20–5.82)
RDW: 15 % — AB (ref 11.0–14.6)
RETIC %: 0.78 % — AB (ref 0.80–1.80)
RETIC CT ABS: 35.88 10*3/uL (ref 34.80–93.90)
WBC: 4.6 10*3/uL (ref 4.0–10.3)

## 2017-02-17 LAB — IRON AND TIBC
%SAT: 13 % — AB (ref 20–55)
Iron: 48 ug/dL (ref 42–163)
TIBC: 374 ug/dL (ref 202–409)
UIBC: 326 ug/dL (ref 117–376)

## 2017-02-17 LAB — FERRITIN: Ferritin: 40 ng/ml (ref 22–316)

## 2017-02-17 NOTE — Telephone Encounter (Signed)
Gave patient avs report and appointments for April and May.  °

## 2017-02-23 NOTE — Progress Notes (Signed)
HEMATOLOGY/ONCOLOGY CLINIC NOTE  Date of Service: .02/17/2017  Patient Care Team: [name], MD as PCP - General (Internal Medicine)   Gastroenterology: Dr. 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 Paulita Fujita showed benign polyps. He was recommended repeat colonoscopy in 3-5 years.. .  Current Treatment IV Iron as needed to maintain ferritin >100 B complex 1 cap po daily B12 500 g daily orally  HISTORY OF PRESENTING ILLNESS:  (Plz see initial consultation for details regarding initial presentation)  INTERVAL HISTORY  Mr Guettler is here for f/u regarding his iron deficiency anemia. He notes no overt GIB bleeding but his ferritin levels are down from 207 to 40 suggesting ongoing GI  Blood loss. He is keen to get additional IV iron to stay on track to not get very anemic as previously. No other acute new concerns. Not using blood thinners.  MEDICAL HISTORY:  Past Medical History:  Diagnosis Date  . Anemia   . Diabetes mellitus without complication ([place])   . Hyperlipidemia   . Hypertension   . Iron deficiency   . Macular degeneration   . Meningitis     SURGICAL HISTORY: Past Surgical History:  Procedure Laterality Date  . APPENDECTOMY    . COLONOSCOPY    . ESOPHAGOGASTRODUODENOSCOPY    . ESOPHAGOGASTRODUODENOSCOPY (EGD) WITH PROPOFOL N/A 12/10/2015   Procedure: ESOPHAGOGASTRODUODENOSCOPY (EGD) WITH PROPOFOL;  Surgeon: Wilford Corner, MD;  Location: [facility] ENDOSCOPY;  Service: Gastroenterology;  Laterality: N/A;  . GIVENS CAPSULE STUDY N/A 11/10/2015   Procedure: GIVENS CAPSULE STUDY;  Surgeon: Arta Silence, MD;  Location: [facility] ENDOSCOPY;  Service: Endoscopy;  Laterality: N/A;  . HERNIA REPAIR    . TONSILLECTOMY       SOCIAL HISTORY: Social History   Social History  . Marital status: Single    Spouse name: N/A  . Number of children: N/A  . Years of education: N/A   Occupational History  . Not on file.   Social History Main Topics  . Smoking status: Never Smoker  . Smokeless tobacco: Never Used  . Alcohol use No  . Drug use: No  . Sexual activity: No   Other Topics Concern  . Not on file   Social History Narrative  . No narrative on file    FAMILY HISTORY: Family History  Problem Relation Age of Onset  . CAD Mother     ALLERGIES:  has No Known Allergies.  MEDICATIONS:  Current Outpatient Prescriptions  Medication Sig Dispense Refill  . ACCU-CHEK AVIVA PLUS test strip     . amLODipine (NORVASC) 5 MG tablet Take 5 mg by mouth every evening.     Marland Kitchen atorvastatin (LIPITOR) 80 MG tablet Take 80 mg by mouth daily at 6 PM.     . b complex vitamins capsule Take 1 capsule by mouth daily.    . B Complex-C-E-Zn (STRESS-600/ZINC PO) Take 1 tablet by mouth daily.    Marland Kitchen CALCIUM-MAGNESIUM-VITAMIN D ER PO Take by mouth.    . docusate sodium (COLACE) 100 MG capsule Take 2 capsules (200 mg total) by mouth at bedtime as needed for mild constipation or moderate constipation. 60 capsule 0  . Fish Oil-Cholecalciferol (OMEGA-3 + VITAMIN D3 PO) Take 1 tablet by mouth daily.    Marland Kitchen  glimepiride (AMARYL) 2 MG tablet Take 2 mg by mouth daily with breakfast.    . metFORMIN (GLUCOPHAGE) 500 MG tablet Take 500 mg by mouth every evening.     . Multiple Vitamins-Minerals (ICAPS) CAPS Take 1 capsule by mouth daily.    Marland Kitchen omeprazole (PRILOSEC) 20 MG capsule Take 20 mg by mouth as needed.    . pioglitazone (ACTOS) 15 MG tablet      No current facility-administered medications for this visit.    Facility-Administered Medications Ordered in Other Visits  Medication Dose Route Frequency Provider Last Rate Last Dose  . 0.9 %  sodium chloride infusion   Intravenous Once [name], MD      . acetaminophen (TYLENOL)  tablet 650 mg  650 mg Oral Once [name], MD      . diphenhydrAMINE (BENADRYL) capsule 25 mg  25 mg Oral Once [name], MD        REVIEW OF SYSTEMS:    10 Point review of Systems was done is negative except as noted above.  PHYSICAL EXAMINATION: ECOG PERFORMANCE STATUS: 2 - Symptomatic, <50% confined to bed  . Vitals:   02/17/17 1104  Weight: 248 lb 4.8 oz (112.6 kg)  Height: 5\' 10"  (1.778 m)   Filed Weights   02/17/17 1104  Weight: 248 lb 4.8 oz (112.6 kg)   .Body mass index is 35.63 kg/m.  GENERAL:alert, in no acute distress and comfortable SKIN: skin color, texture, turgor are normal, no rashes or significant lesions EYES: normal, conjunctiva are pink and non-injected, sclera clear OROPHARYNX:no exudate, no erythema and lips, buccal mucosa, and tongue normal  NECK: supple, no JVD, thyroid normal size, non-tender, without nodularity LYMPH:  no palpable lymphadenopathy in the cervical, axillary or inguinal LUNGS: clear to auscultation with normal respiratory effort HEART: regular rate & rhythm,  no murmurs and no lower extremity edema ABDOMEN: abdomen soft, non-tender, normoactive bowel sounds , no hepatosplenomegaly  Musculoskeletal: no cyanosis of digits and no clubbing  PSYCH: alert & oriented x 3 with fluent speech NEURO: no focal motor/sensory deficits  LABORATORY DATA:  I have reviewed the data as listed  . CBC Latest Ref Rng & Units 02/17/2017 02/12/2017 02/12/2017  WBC 4.0 - 10.3 10e3/uL 4.6 - 14.5(H)  Hemoglobin 13.0 - 17.1 g/dL 13.3 15.0 14.3  Hematocrit 38.4 - 49.9 % 39.7 44.0 43.1  Platelets 140 - 400 10e3/uL 201 - 225   . CBC    Component Value Date/Time   WBC 4.6 02/17/2017 0959   WBC 14.5 (H) 02/12/2017 1035   RBC 4.60 02/17/2017 0959   RBC 4.93 02/12/2017 1035   HGB 13.3 02/17/2017 0959   HCT 39.7 02/17/2017 0959   PLT 201 02/17/2017 0959   MCV 86.3 02/17/2017 0959   MCH 28.9 02/17/2017 0959   MCH 29.0 02/12/2017 1035   MCHC 33.5 02/17/2017  0959   MCHC 33.2 02/12/2017 1035   RDW 15.0 (H) 02/17/2017 0959   LYMPHSABS 0.4 (L) 02/17/2017 0959   MONOABS 0.6 02/17/2017 0959   EOSABS 0.2 02/17/2017 0959   BASOSABS 0.0 02/17/2017 0959     . CMP Latest Ref Rng & Units 02/17/2017 02/12/2017 02/12/2017  Glucose 70 - 140 mg/dl 90 152(H) 151(H)  BUN 7.0 - 26.0 mg/dL 15.2 28(H) 27(H)  Creatinine 0.7 - 1.3 mg/dL 1.0 1.10 1.17  Sodium 136 - 145 mEq/L 140 140 139  Potassium 3.5 - 5.1 mEq/L 4.1 4.6 4.3  Chloride 101 - 111 mmol/L - 108 108  CO2 22 -  29 mEq/L 23 - 23  Calcium 8.4 - 10.4 mg/dL 9.3 - 9.2  Total Protein 6.4 - 8.3 g/dL 7.3 - -  Total Bilirubin 0.20 - 1.20 mg/dL 0.49 - -  Alkaline Phos 40 - 150 U/L 83 - -  AST 5 - 34 U/L 23 - -  ALT 0 - 55 U/L 31 - -   . Lab Results  Component Value Date   IRON 48 02/17/2017   TIBC 374 02/17/2017   IRONPCTSAT 13 (L) 02/17/2017   (Iron and TIBC)  Lab Results  Component Value Date   FERRITIN 40 02/17/2017    RADIOGRAPHIC STUDIES: I have personally reviewed the radiological images as listed and agreed with the findings in the report. No results found.  RADIOGRAPHIC STUDIES: I have personally reviewed the radiological images as listed and agreed with the findings in the report. No results found.  ASSESSMENT & PLAN:   76 year old with   #1 Microcytic hypochromic anemia. Iron deficiency anemia recurrent and likely due to ongoing GI blood losses. Fecal occult blood x 2 was positive on 10/31/2015. The source of GI bleeding thought to be his Ulcers related to his hiatal hernia . Patient has refused surgical option to address his hiatal hernia with ulceration causing recurrent chronic GI bleeds. Platelet function, PT and PTT were within normal limits and suggest against the presence of a hemostatic defect. Plan -ferritin is down to 40. No overt gi bleeding. -we will schedule him for IV Injectafer 750mg  weekly x 2 doses - Will continue to replace this as needed to maintain ferritin  levels >100 and iron saturation of >30 -Continue oral B12 and B complex replacement to support accelerated erythropoiesis. -Continue PPI twice a day pre-meals. -absolutely avoid NSAIDs and other blood thinners.   -Continued close follow-up with primary care physician and GI.  IV INjectafer 750mg  weekly x 2 doses RTC with Dr Irene Limbo in 2 months with labs   All of the patients questions were answered to his apparent satisfaction. The patient knows to call the clinic with any problems, questions or concerns.  I spent 15 minutes counseling the patient face to face. The total time spent in the appointment was 20 minutes and more than 50% was on counseling and direct patient cares.    Sullivan Lone MD [place] AAHIVMS [facility] Hematology/Oncology Physician [facility]  (Office):       [phone] (Work cell):  [phone] (Fax):           [phone]

## 2017-02-24 ENCOUNTER — Ambulatory Visit (HOSPITAL_BASED_OUTPATIENT_CLINIC_OR_DEPARTMENT_OTHER): Payer: Medicare Other

## 2017-02-24 VITALS — BP 159/75 | HR 66 | Temp 98.6°F | Resp 17

## 2017-02-24 DIAGNOSIS — K922 Gastrointestinal hemorrhage, unspecified: Secondary | ICD-10-CM | POA: Diagnosis not present

## 2017-02-24 DIAGNOSIS — D509 Iron deficiency anemia, unspecified: Secondary | ICD-10-CM

## 2017-02-24 DIAGNOSIS — D500 Iron deficiency anemia secondary to blood loss (chronic): Secondary | ICD-10-CM

## 2017-02-24 MED ORDER — FERRIC CARBOXYMALTOSE 750 MG/15ML IV SOLN
750.0000 mg | Freq: Once | INTRAVENOUS | Status: AC
  Administered 2017-02-24: 750 mg via INTRAVENOUS
  Filled 2017-02-24: qty 15

## 2017-02-24 NOTE — Progress Notes (Signed)
Dr Alen Blew made aware of pt's radial pulse of 46-50.  Per Dr Alen Blew ok to proceed with treatment.

## 2017-02-24 NOTE — Patient Instructions (Signed)
Ferric carboxymaltose injection What is this medicine? FERRIC CARBOXYMALTOSE (ferr-ik car-box-ee-mol-toes) is an iron complex. Iron is used to make healthy red blood cells, which carry oxygen and nutrients throughout the body. This medicine is used to treat anemia in people with chronic kidney disease or people who cannot take iron by mouth. This medicine may be used for other purposes; ask your health care provider or pharmacist if you have questions. COMMON BRAND NAME(S): Injectafer What should I tell my health care provider before I take this medicine? They need to know if you have any of these conditions: -anemia not caused by low iron levels -high levels of iron in the blood -liver disease -an unusual or allergic reaction to iron, other medicines, foods, dyes, or preservatives -pregnant or trying to get pregnant -breast-feeding How should I use this medicine? This medicine is for infusion into a vein. It is given by a health care professional in a hospital or clinic setting. Talk to your pediatrician regarding the use of this medicine in children. Special care may be needed. Overdosage: If you think you have taken too much of this medicine contact a poison control center or emergency room at once. NOTE: This medicine is only for you. Do not share this medicine with others. What if I miss a dose? It is important not to miss your dose. Call your doctor or health care professional if you are unable to keep an appointment. What may interact with this medicine? Do not take this medicine with any of the following medications: -deferoxamine -dimercaprol -other iron products This medicine may also interact with the following medications: -chloramphenicol -deferasirox This list may not describe all possible interactions. Give your health care provider a list of all the medicines, herbs, non-prescription drugs, or dietary supplements you use. Also tell them if you smoke, drink alcohol, or use  illegal drugs. Some items may interact with your medicine. What should I watch for while using this medicine? Visit your doctor or health care professional regularly. Tell your doctor if your symptoms do not start to get better or if they get worse. You may need blood work done while you are taking this medicine. You may need to follow a special diet. Talk to your doctor. Foods that contain iron include: whole grains/cereals, dried fruits, beans, or peas, leafy green vegetables, and organ meats (liver, kidney). What side effects may I notice from receiving this medicine? Side effects that you should report to your doctor or health care professional as soon as possible: -allergic reactions like skin rash, itching or hives, swelling of the face, lips, or tongue -breathing problems -changes in blood pressure -feeling faint or lightheaded, falls -flushing, sweating, or hot feelings Side effects that usually do not require medical attention (report to your doctor or health care professional if they continue or are bothersome): -changes in taste -constipation -dizziness -headache -nausea -pain, redness, or irritation at site where injected -vomiting This list may not describe all possible side effects. Call your doctor for medical advice about side effects. You may report side effects to FDA at 1-800-FDA-1088. Where should I keep my medicine? This drug is given in a hospital or clinic and will not be stored at home. NOTE: This sheet is a summary. It may not cover all possible information. If you have questions about this medicine, talk to your doctor, pharmacist, or health care provider.  2018 Elsevier/Gold Standard (2015-12-11 11:20:47)  

## 2017-03-03 ENCOUNTER — Telehealth: Payer: Self-pay | Admitting: *Deleted

## 2017-03-03 ENCOUNTER — Ambulatory Visit: Payer: Self-pay

## 2017-03-03 NOTE — Telephone Encounter (Signed)
Patient has not arrived.  Appointments cancelled and scheduling message sent.

## 2017-03-03 NOTE — Telephone Encounter (Signed)
FYI "I'm calling to reschedule my appointment for Feraheme this morning at 10:15.  I'm leery and don't think I can get there if I have more diarrhea.  I ate something which caused diarrhea this morning."    Received Feraheme on 02-24-2017.  Encouraged to keep today's appointment if possible for best treatment outcome.  If does not feel better within the next two hours to arrive for the 10:15 am appointment, a message will be sent to scheduling team to reschedule this treatment.

## 2017-03-04 ENCOUNTER — Ambulatory Visit: Payer: Self-pay

## 2017-03-11 ENCOUNTER — Ambulatory Visit (HOSPITAL_BASED_OUTPATIENT_CLINIC_OR_DEPARTMENT_OTHER): Payer: Medicare Other

## 2017-03-11 VITALS — BP 157/89 | HR 78 | Temp 98.6°F | Resp 17

## 2017-03-11 DIAGNOSIS — D509 Iron deficiency anemia, unspecified: Secondary | ICD-10-CM

## 2017-03-11 DIAGNOSIS — K922 Gastrointestinal hemorrhage, unspecified: Secondary | ICD-10-CM

## 2017-03-11 DIAGNOSIS — D500 Iron deficiency anemia secondary to blood loss (chronic): Secondary | ICD-10-CM

## 2017-03-11 MED ORDER — FERRIC CARBOXYMALTOSE 750 MG/15ML IV SOLN
750.0000 mg | Freq: Once | INTRAVENOUS | Status: AC
  Administered 2017-03-11: 750 mg via INTRAVENOUS
  Filled 2017-03-11: qty 15

## 2017-03-11 NOTE — Patient Instructions (Signed)
Ferric carboxymaltose injection What is this medicine? FERRIC CARBOXYMALTOSE (ferr-ik car-box-ee-mol-toes) is an iron complex. Iron is used to make healthy red blood cells, which carry oxygen and nutrients throughout the body. This medicine is used to treat anemia in people with chronic kidney disease or people who cannot take iron by mouth. This medicine may be used for other purposes; ask your health care provider or pharmacist if you have questions. COMMON BRAND NAME(S): Injectafer What should I tell my health care provider before I take this medicine? They need to know if you have any of these conditions: -anemia not caused by low iron levels -high levels of iron in the blood -liver disease -an unusual or allergic reaction to iron, other medicines, foods, dyes, or preservatives -pregnant or trying to get pregnant -breast-feeding How should I use this medicine? This medicine is for infusion into a vein. It is given by a health care professional in a hospital or clinic setting. Talk to your pediatrician regarding the use of this medicine in children. Special care may be needed. Overdosage: If you think you have taken too much of this medicine contact a poison control center or emergency room at once. NOTE: This medicine is only for you. Do not share this medicine with others. What if I miss a dose? It is important not to miss your dose. Call your doctor or health care professional if you are unable to keep an appointment. What may interact with this medicine? Do not take this medicine with any of the following medications: -deferoxamine -dimercaprol -other iron products This medicine may also interact with the following medications: -chloramphenicol -deferasirox This list may not describe all possible interactions. Give your health care provider a list of all the medicines, herbs, non-prescription drugs, or dietary supplements you use. Also tell them if you smoke, drink alcohol, or use  illegal drugs. Some items may interact with your medicine. What should I watch for while using this medicine? Visit your doctor or health care professional regularly. Tell your doctor if your symptoms do not start to get better or if they get worse. You may need blood work done while you are taking this medicine. You may need to follow a special diet. Talk to your doctor. Foods that contain iron include: whole grains/cereals, dried fruits, beans, or peas, leafy green vegetables, and organ meats (liver, kidney). What side effects may I notice from receiving this medicine? Side effects that you should report to your doctor or health care professional as soon as possible: -allergic reactions like skin rash, itching or hives, swelling of the face, lips, or tongue -breathing problems -changes in blood pressure -feeling faint or lightheaded, falls -flushing, sweating, or hot feelings Side effects that usually do not require medical attention (report to your doctor or health care professional if they continue or are bothersome): -changes in taste -constipation -dizziness -headache -nausea -pain, redness, or irritation at site where injected -vomiting This list may not describe all possible side effects. Call your doctor for medical advice about side effects. You may report side effects to FDA at 1-800-FDA-1088. Where should I keep my medicine? This drug is given in a hospital or clinic and will not be stored at home. NOTE: This sheet is a summary. It may not cover all possible information. If you have questions about this medicine, talk to your doctor, pharmacist, or health care provider.  2018 Elsevier/Gold Standard (2015-12-11 11:20:47)  

## 2017-04-13 ENCOUNTER — Other Ambulatory Visit: Payer: Self-pay | Admitting: *Deleted

## 2017-04-13 DIAGNOSIS — D500 Iron deficiency anemia secondary to blood loss (chronic): Secondary | ICD-10-CM

## 2017-04-14 ENCOUNTER — Other Ambulatory Visit (HOSPITAL_BASED_OUTPATIENT_CLINIC_OR_DEPARTMENT_OTHER): Payer: Medicare Other

## 2017-04-14 ENCOUNTER — Ambulatory Visit (HOSPITAL_BASED_OUTPATIENT_CLINIC_OR_DEPARTMENT_OTHER): Payer: Medicare Other | Admitting: Hematology

## 2017-04-14 ENCOUNTER — Telehealth: Payer: Self-pay | Admitting: Hematology

## 2017-04-14 ENCOUNTER — Encounter: Payer: Self-pay | Admitting: Hematology

## 2017-04-14 VITALS — BP 165/54 | HR 52 | Temp 98.5°F | Resp 18 | Ht 70.0 in | Wt 248.3 lb

## 2017-04-14 DIAGNOSIS — K922 Gastrointestinal hemorrhage, unspecified: Secondary | ICD-10-CM | POA: Diagnosis not present

## 2017-04-14 DIAGNOSIS — K469 Unspecified abdominal hernia without obstruction or gangrene: Secondary | ICD-10-CM | POA: Diagnosis not present

## 2017-04-14 DIAGNOSIS — D509 Iron deficiency anemia, unspecified: Secondary | ICD-10-CM | POA: Diagnosis not present

## 2017-04-14 DIAGNOSIS — D500 Iron deficiency anemia secondary to blood loss (chronic): Secondary | ICD-10-CM

## 2017-04-14 LAB — COMPREHENSIVE METABOLIC PANEL
ALBUMIN: 3.9 g/dL (ref 3.5–5.0)
ALK PHOS: 101 U/L (ref 40–150)
ALT: 23 U/L (ref 0–55)
ANION GAP: 9 meq/L (ref 3–11)
AST: 18 U/L (ref 5–34)
BILIRUBIN TOTAL: 0.71 mg/dL (ref 0.20–1.20)
BUN: 16 mg/dL (ref 7.0–26.0)
CO2: 26 meq/L (ref 22–29)
CREATININE: 1.1 mg/dL (ref 0.7–1.3)
Calcium: 9.7 mg/dL (ref 8.4–10.4)
Chloride: 107 mEq/L (ref 98–109)
EGFR: 67 mL/min/{1.73_m2} — ABNORMAL LOW (ref >90)
GLUCOSE: 87 mg/dL (ref 70–140)
Potassium: 4.3 mEq/L (ref 3.5–5.1)
SODIUM: 141 meq/L (ref 136–145)
TOTAL PROTEIN: 7.5 g/dL (ref 6.4–8.3)

## 2017-04-14 LAB — CBC WITH DIFFERENTIAL/PLATELET
BASO%: 0.9 % (ref 0.0–2.0)
BASOS ABS: 0 10*3/uL (ref 0.0–0.1)
EOS ABS: 0.3 10*3/uL (ref 0.0–0.5)
EOS%: 6.5 % (ref 0.0–7.0)
HCT: 42.9 % (ref 38.4–49.9)
HGB: 14.1 g/dL (ref 13.0–17.1)
LYMPH#: 0.7 10*3/uL — AB (ref 0.9–3.3)
LYMPH%: 14.2 % (ref 14.0–49.0)
MCH: 29.4 pg (ref 27.2–33.4)
MCHC: 32.9 g/dL (ref 32.0–36.0)
MCV: 89.4 fL (ref 79.3–98.0)
MONO#: 0.4 10*3/uL (ref 0.1–0.9)
MONO%: 8.4 % (ref 0.0–14.0)
NEUT#: 3.6 10*3/uL (ref 1.5–6.5)
NEUT%: 70 % (ref 39.0–75.0)
PLATELETS: 220 10*3/uL (ref 140–400)
RBC: 4.79 10*6/uL (ref 4.20–5.82)
RDW: 15.9 % — ABNORMAL HIGH (ref 11.0–14.6)
WBC: 5.1 10*3/uL (ref 4.0–10.3)

## 2017-04-14 LAB — IRON AND TIBC
%SAT: 28 % (ref 20–55)
Iron: 97 ug/dL (ref 42–163)
TIBC: 350 ug/dL (ref 202–409)
UIBC: 253 ug/dL (ref 117–376)

## 2017-04-14 LAB — FERRITIN: Ferritin: 275 ng/ml (ref 22–316)

## 2017-04-14 NOTE — Progress Notes (Signed)
HEMATOLOGY/ONCOLOGY CLINIC NOTE  Date of Service: .04/14/2017  Patient Care Team: [name], MD as PCP - General (Internal Medicine)   Gastroenterology: Dr. 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 Paulita Fujita showed benign polyps. He was recommended repeat colonoscopy in 3-5 years.. .  Current Treatment IV Iron as needed to maintain ferritin >100 B complex 1 cap po daily B12 500 g daily orally  HISTORY OF PRESENTING ILLNESS:  (Plz see initial consultation for details regarding initial presentation)  INTERVAL HISTORY  [name] is here for f/u regarding his iron deficiency anemia. He notes no overt GIB bleeding. He notes that he is feeling good with no fatigue and good energy levels .No other acute new concerns. Not using blood thinners. Labs show a hemoglobin of 14.1 with a ferritin level of 275.  MEDICAL HISTORY:  Past Medical History:  Diagnosis Date  . Anemia   . Diabetes mellitus without complication ([place])   . Hyperlipidemia   . Hypertension   . Iron deficiency   . Macular degeneration   . Meningitis     SURGICAL HISTORY: Past Surgical History:  Procedure Laterality Date  . APPENDECTOMY    . COLONOSCOPY    . ESOPHAGOGASTRODUODENOSCOPY    . ESOPHAGOGASTRODUODENOSCOPY (EGD) WITH PROPOFOL N/A 12/10/2015   Procedure: ESOPHAGOGASTRODUODENOSCOPY (EGD) WITH PROPOFOL;  Surgeon: Wilford Corner, MD;  Location: [facility] ENDOSCOPY;  Service: Gastroenterology;  Laterality: N/A;  . GIVENS CAPSULE STUDY N/A 11/10/2015   Procedure: GIVENS CAPSULE STUDY;  Surgeon: Arta Silence, MD;  Location: [facility] ENDOSCOPY;  Service: Endoscopy;  Laterality: N/A;  . HERNIA REPAIR    . TONSILLECTOMY      SOCIAL HISTORY: Social History    Social History  . Marital status: Single    Spouse name: N/A  . Number of children: N/A  . Years of education: N/A   Occupational History  . Not on file.   Social History Main Topics  . Smoking status: Never Smoker  . Smokeless tobacco: Never Used  . Alcohol use No  . Drug use: No  . Sexual activity: No   Other Topics Concern  . Not on file   Social History Narrative  . No narrative on file    FAMILY HISTORY: Family History  Problem Relation Age of Onset  . CAD Mother     ALLERGIES:  has No Known Allergies.  MEDICATIONS:  Current Outpatient Prescriptions  Medication Sig Dispense Refill  . ACCU-CHEK AVIVA PLUS test strip     . amLODipine (NORVASC) 5 MG tablet Take 5 mg by mouth every evening.     Marland Kitchen atorvastatin (LIPITOR) 80 MG tablet Take 80 mg by mouth daily at 6 PM.     . b complex vitamins capsule Take 1 capsule by mouth daily.    . B Complex-C-E-Zn (STRESS-600/ZINC PO) Take 1 tablet by mouth daily.    Marland Kitchen CALCIUM-MAGNESIUM-VITAMIN D ER PO Take by mouth.    . docusate sodium (COLACE) 100 MG capsule Take 2 capsules (200 mg total) by mouth at bedtime as needed for mild constipation or moderate constipation. 60 capsule 0  . Fish Oil-Cholecalciferol (OMEGA-3 + VITAMIN D3 PO) Take 1 tablet by mouth daily.    Marland Kitchen glimepiride (AMARYL) 2 MG tablet Take 2 mg by  mouth daily with breakfast.    . metFORMIN (GLUCOPHAGE) 500 MG tablet Take 500 mg by mouth every evening.     . Multiple Vitamins-Minerals (ICAPS) CAPS Take 1 capsule by mouth daily.    Marland Kitchen omeprazole (PRILOSEC) 20 MG capsule Take 20 mg by mouth as needed.    . pioglitazone (ACTOS) 15 MG tablet      No current facility-administered medications for this visit.    Facility-Administered Medications Ordered in Other Visits  Medication Dose Route Frequency Provider Last Rate Last Dose  . 0.9 %  sodium chloride infusion   Intravenous Once [name], MD      . acetaminophen (TYLENOL) tablet 650 mg  650 mg Oral Once [name], MD      . diphenhydrAMINE (BENADRYL) capsule 25 mg  25 mg Oral Once [name], MD        REVIEW OF SYSTEMS:    10 Point review of Systems was done is negative except as noted above.  PHYSICAL EXAMINATION: ECOG PERFORMANCE STATUS: 2 - Symptomatic, <50% confined to bed  . Vitals:   04/14/17 1015  Weight: 248 lb 4.8 oz (112.6 kg)  Height: 5\' 10"  (1.778 m)   Filed Weights   04/14/17 1015  Weight: 248 lb 4.8 oz (112.6 kg)   .Body mass index is 35.63 kg/m.  GENERAL:alert, in no acute distress and comfortable SKIN: skin color, texture, turgor are normal, no rashes or significant lesions EYES: normal, conjunctiva are pink and non-injected, sclera clear OROPHARYNX:no exudate, no erythema and lips, buccal mucosa, and tongue normal  NECK: supple, no JVD, thyroid normal size, non-tender, without nodularity LYMPH:  no palpable lymphadenopathy in the cervical, axillary or inguinal LUNGS: clear to auscultation with normal respiratory effort HEART: regular rate & rhythm,  no murmurs and no lower extremity edema ABDOMEN: abdomen soft, non-tender, normoactive bowel sounds , no hepatosplenomegaly  Musculoskeletal: no cyanosis of digits and no clubbing  PSYCH: alert & oriented x 3 with fluent speech NEURO: no focal motor/sensory deficits  LABORATORY DATA:  I have reviewed the data as listed  . CBC Latest Ref Rng & Units 04/14/2017 02/17/2017 02/12/2017  WBC 4.0 - 10.3 10e3/uL 5.1 4.6 -  Hemoglobin 13.0 - 17.1 g/dL 14.1 13.3 15.0  Hematocrit 38.4 - 49.9 % 42.9 39.7 44.0  Platelets 140 - 400 10e3/uL 220 201 -   . CBC    Component Value Date/Time   WBC 5.1 04/14/2017 0933   WBC 14.5 (H) 02/12/2017 1035   RBC 4.79 04/14/2017 0933   RBC 4.93 02/12/2017 1035   HGB 14.1 04/14/2017 0933   HCT 42.9 04/14/2017 0933   PLT 220 04/14/2017 0933   MCV 89.4 04/14/2017 0933   MCH 29.4 04/14/2017 0933   MCH 29.0 02/12/2017 1035   MCHC 32.9 04/14/2017 0933   MCHC 33.2 02/12/2017 1035    RDW 15.9 (H) 04/14/2017 0933   LYMPHSABS 0.7 (L) 04/14/2017 0933   MONOABS 0.4 04/14/2017 0933   EOSABS 0.3 04/14/2017 0933   BASOSABS 0.0 04/14/2017 0933     . CMP Latest Ref Rng & Units 04/14/2017 02/17/2017 02/12/2017  Glucose 70 - 140 mg/dl 87 90 152(H)  BUN 7.0 - 26.0 mg/dL 16.0 15.2 28(H)  Creatinine 0.7 - 1.3 mg/dL 1.1 1.0 1.10  Sodium 136 - 145 mEq/L 141 140 140  Potassium 3.5 - 5.1 mEq/L 4.3 4.1 4.6  Chloride 101 - 111 mmol/L - - 108  CO2 22 - 29 mEq/L 26 23 -  Calcium 8.4 -  10.4 mg/dL 9.7 9.3 -  Total Protein 6.4 - 8.3 g/dL 7.5 7.3 -  Total Bilirubin 0.20 - 1.20 mg/dL 0.71 0.49 -  Alkaline Phos 40 - 150 U/L 101 83 -  AST 5 - 34 U/L 18 23 -  ALT 0 - 55 U/L 23 31 -   . Lab Results  Component Value Date   IRON 97 04/14/2017   TIBC 350 04/14/2017   IRONPCTSAT 28 04/14/2017   (Iron and TIBC)  Lab Results  Component Value Date   FERRITIN 275 04/14/2017    RADIOGRAPHIC STUDIES: I have personally reviewed the radiological images as listed and agreed with the findings in the report. No results found.  RADIOGRAPHIC STUDIES: I have personally reviewed the radiological images as listed and agreed with the findings in the report. No results found.  ASSESSMENT & PLAN:   76 year old with   #1 Microcytic hypochromic anemia. Iron deficiency anemia recurrent and likely due to ongoing GI blood losses. Fecal occult blood x 2 was positive on 10/31/2015. The source of GI bleeding thought to be his Ulcers related to his hiatal hernia . Patient has refused surgical option to address his hiatal hernia with ulceration causing recurrent chronic GI bleeds. Platelet function, PT and PTT were within normal limits and suggest against the presence of a hemostatic defect.  Patient's hemoglobin levels are completely normal today at 14.1 with an MCV of 89. Ferritin levels are 275 with an iron saturation of nearly 30%.  Plan -Patient is feeling well and is glad with his lab results. No  clinical evidence of overt GI bleeding at this time. -No indication for additional IV iron at this time - Will continue to replace this as needed to maintain ferritin levels >100 and iron saturation of >30 -Continue oral B12 and B complex replacement to support accelerated erythropoiesis. -Continue PPI twice a day pre-meals. -absolutely avoid NSAIDs and other blood thinners.   -Continued close follow-up with primary care physician and GI.  RTC with Dr Irene Limbo in 3-4 months with labs   All of the patients questions were answered to his apparent satisfaction. The patient knows to call the clinic with any problems, questions or concerns.  I spent 15 minutes counseling the patient face to face. The total time spent in the appointment was 20 minutes and more than 50% was on counseling and direct patient cares.    Sullivan Lone MD [place] AAHIVMS [facility] Hematology/Oncology Physician [facility]  (Office):       [phone] (Work cell):  [phone] (Fax):           [phone]

## 2017-04-14 NOTE — Patient Instructions (Signed)
Thank you for choosing [facility] to provide your oncology and hematology care.  To afford each patient quality time with our providers, please arrive 30 minutes before your scheduled appointment time.  If you arrive late for your appointment, you may be asked to reschedule.  We strive to give you quality time with our providers, and arriving late affects you and other patients whose appointments are after yours.   If you are a no show for multiple scheduled visits, you may be dismissed from the clinic at the providers discretion.    Again, thank you for choosing [facility], our hope is that these requests will decrease the amount of time that you wait before being seen by our physicians.  ______________________________________________________________________  Should you have questions after your visit to the [facility], please contact our office at [phone] between the hours of 8:30 and 4:30 p.m.    Voicemails left after 4:30p.m will not be returned until the following business day.    For prescription refill requests, please have your pharmacy contact us directly.  Please also try to allow 48 hours for prescription requests.    Please contact the scheduling department for questions regarding scheduling.  For scheduling of procedures such as PET scans, CT scans, MRI, Ultrasound, etc please contact central scheduling at [phone].    Resources For Cancer Patients and Caregivers:   Oncolink.org:  A wonderful resource for patients and healthcare providers for information regarding your disease, ways to tract your treatment, what to expect, etc.     American Cancer Society:  800-227-2345  Can help patients locate various types of support and financial assistance  Cancer Care: 1-800-813-HOPE (4673) Provides financial assistance, online support groups, medication/co-pay assistance.    [place] DSS:  336-641-3447 Where to apply for food  stamps, Medicaid, and utility assistance  Medicare Rights Center: 800-333-4114 Helps people with Medicare understand their rights and benefits, navigate the Medicare system, and secure the quality healthcare they deserve  SCAT: 336-333-6589 [place] [employer]'s shared-ride transportation service for eligible riders who have a disability that prevents them from riding the fixed route bus.    For additional information on assistance programs please contact our social worker:   Grier Hock/Abigail Elmore:  [phone]            

## 2017-04-14 NOTE — Telephone Encounter (Signed)
Gave patient AVS and calender per LOS 5/24. RTC in 14 weeks

## 2017-05-02 DIAGNOSIS — I10 Essential (primary) hypertension: Secondary | ICD-10-CM | POA: Diagnosis not present

## 2017-05-02 DIAGNOSIS — E611 Iron deficiency: Secondary | ICD-10-CM | POA: Diagnosis not present

## 2017-05-02 DIAGNOSIS — E119 Type 2 diabetes mellitus without complications: Secondary | ICD-10-CM | POA: Diagnosis not present

## 2017-05-05 DIAGNOSIS — I10 Essential (primary) hypertension: Secondary | ICD-10-CM | POA: Diagnosis not present

## 2017-05-05 DIAGNOSIS — D509 Iron deficiency anemia, unspecified: Secondary | ICD-10-CM | POA: Diagnosis not present

## 2017-05-05 DIAGNOSIS — E119 Type 2 diabetes mellitus without complications: Secondary | ICD-10-CM | POA: Diagnosis not present

## 2017-05-05 DIAGNOSIS — E7800 Pure hypercholesterolemia, unspecified: Secondary | ICD-10-CM | POA: Diagnosis not present

## 2017-06-20 DIAGNOSIS — H43813 Vitreous degeneration, bilateral: Secondary | ICD-10-CM | POA: Diagnosis not present

## 2017-06-20 DIAGNOSIS — E119 Type 2 diabetes mellitus without complications: Secondary | ICD-10-CM | POA: Diagnosis not present

## 2017-06-20 DIAGNOSIS — H353132 Nonexudative age-related macular degeneration, bilateral, intermediate dry stage: Secondary | ICD-10-CM | POA: Diagnosis not present

## 2017-06-20 DIAGNOSIS — H40013 Open angle with borderline findings, low risk, bilateral: Secondary | ICD-10-CM | POA: Diagnosis not present

## 2017-06-20 DIAGNOSIS — H35033 Hypertensive retinopathy, bilateral: Secondary | ICD-10-CM | POA: Diagnosis not present

## 2017-07-21 ENCOUNTER — Encounter: Payer: Self-pay | Admitting: Hematology

## 2017-07-21 ENCOUNTER — Other Ambulatory Visit (HOSPITAL_BASED_OUTPATIENT_CLINIC_OR_DEPARTMENT_OTHER): Payer: Medicare Other

## 2017-07-21 ENCOUNTER — Ambulatory Visit (HOSPITAL_BASED_OUTPATIENT_CLINIC_OR_DEPARTMENT_OTHER): Payer: Medicare Other | Admitting: Hematology

## 2017-07-21 ENCOUNTER — Telehealth: Payer: Self-pay | Admitting: Hematology

## 2017-07-21 VITALS — BP 171/75 | HR 75 | Temp 97.8°F | Resp 15 | Ht 70.0 in | Wt 251.2 lb

## 2017-07-21 DIAGNOSIS — K922 Gastrointestinal hemorrhage, unspecified: Secondary | ICD-10-CM

## 2017-07-21 DIAGNOSIS — K449 Diaphragmatic hernia without obstruction or gangrene: Secondary | ICD-10-CM | POA: Diagnosis not present

## 2017-07-21 DIAGNOSIS — D500 Iron deficiency anemia secondary to blood loss (chronic): Secondary | ICD-10-CM

## 2017-07-21 DIAGNOSIS — D509 Iron deficiency anemia, unspecified: Secondary | ICD-10-CM

## 2017-07-21 DIAGNOSIS — K254 Chronic or unspecified gastric ulcer with hemorrhage: Secondary | ICD-10-CM

## 2017-07-21 LAB — COMPREHENSIVE METABOLIC PANEL
ALBUMIN: 3.6 g/dL (ref 3.5–5.0)
ALK PHOS: 95 U/L (ref 40–150)
ALT: 23 U/L (ref 0–55)
AST: 20 U/L (ref 5–34)
Anion Gap: 8 mEq/L (ref 3–11)
BILIRUBIN TOTAL: 0.59 mg/dL (ref 0.20–1.20)
BUN: 18.8 mg/dL (ref 7.0–26.0)
CALCIUM: 9.8 mg/dL (ref 8.4–10.4)
CO2: 29 mEq/L (ref 22–29)
CREATININE: 1 mg/dL (ref 0.7–1.3)
Chloride: 106 mEq/L (ref 98–109)
EGFR: 69 mL/min/{1.73_m2} — ABNORMAL LOW (ref >90)
GLUCOSE: 91 mg/dL (ref 70–140)
Potassium: 4.3 mEq/L (ref 3.5–5.1)
SODIUM: 142 meq/L (ref 136–145)
TOTAL PROTEIN: 7.5 g/dL (ref 6.4–8.3)

## 2017-07-21 LAB — IRON AND TIBC
%SAT: 16 % — ABNORMAL LOW (ref 20–55)
Iron: 56 ug/dL (ref 42–163)
TIBC: 350 ug/dL (ref 202–409)
UIBC: 294 ug/dL (ref 117–376)

## 2017-07-21 LAB — CBC & DIFF AND RETIC
BASO%: 0.8 % (ref 0.0–2.0)
BASOS ABS: 0 10*3/uL (ref 0.0–0.1)
EOS ABS: 0.3 10*3/uL (ref 0.0–0.5)
EOS%: 6.2 % (ref 0.0–7.0)
HEMATOCRIT: 41.6 % (ref 38.4–49.9)
HEMOGLOBIN: 13.6 g/dL (ref 13.0–17.1)
IMMATURE RETIC FRACT: 7.6 % (ref 3.00–10.60)
LYMPH%: 11.7 % — AB (ref 14.0–49.0)
MCH: 29.2 pg (ref 27.2–33.4)
MCHC: 32.7 g/dL (ref 32.0–36.0)
MCV: 89.3 fL (ref 79.3–98.0)
MONO#: 0.5 10*3/uL (ref 0.1–0.9)
MONO%: 9.5 % (ref 0.0–14.0)
NEUT%: 71.8 % (ref 39.0–75.0)
NEUTROS ABS: 3.6 10*3/uL (ref 1.5–6.5)
Platelets: 204 10*3/uL (ref 140–400)
RBC: 4.66 10*6/uL (ref 4.20–5.82)
RDW: 14.1 % (ref 11.0–14.6)
Retic %: 0.96 % (ref 0.80–1.80)
Retic Ct Abs: 44.74 10*3/uL (ref 34.80–93.90)
WBC: 5 10*3/uL (ref 4.0–10.3)
lymph#: 0.6 10*3/uL — ABNORMAL LOW (ref 0.9–3.3)

## 2017-07-21 LAB — FERRITIN: Ferritin: 55 ng/ml (ref 22–316)

## 2017-07-21 NOTE — Telephone Encounter (Signed)
Spoke with patient about appts. Did not want avs or calendar.  °

## 2017-07-21 NOTE — Patient Instructions (Signed)
Thank you for choosing [facility] to provide your oncology and hematology care.  To afford each patient quality time with our providers, please arrive 30 minutes before your scheduled appointment time.  If you arrive late for your appointment, you may be asked to reschedule.  We strive to give you quality time with our providers, and arriving late affects you and other patients whose appointments are after yours.   If you are a no show for multiple scheduled visits, you may be dismissed from the clinic at the providers discretion.    Again, thank you for choosing [facility], our hope is that these requests will decrease the amount of time that you wait before being seen by our physicians.  ______________________________________________________________________  Should you have questions after your visit to the [facility], please contact our office at [phone] between the hours of 8:30 and 4:30 p.m.    Voicemails left after 4:30p.m will not be returned until the following business day.    For prescription refill requests, please have your pharmacy contact us directly.  Please also try to allow 48 hours for prescription requests.    Please contact the scheduling department for questions regarding scheduling.  For scheduling of procedures such as PET scans, CT scans, MRI, Ultrasound, etc please contact central scheduling at [phone].    Resources For Cancer Patients and Caregivers:   Oncolink.org:  A wonderful resource for patients and healthcare providers for information regarding your disease, ways to tract your treatment, what to expect, etc.     American Cancer Society:  800-227-2345  Can help patients locate various types of support and financial assistance  Cancer Care: 1-800-813-HOPE (4673) Provides financial assistance, online support groups, medication/co-pay assistance.    [place] DSS:  336-641-3447 Where to apply for food  stamps, Medicaid, and utility assistance  Medicare Rights Center: 800-333-4114 Helps people with Medicare understand their rights and benefits, navigate the Medicare system, and secure the quality healthcare they deserve  SCAT: 336-333-6589 [place] [employer]'s shared-ride transportation service for eligible riders who have a disability that prevents them from riding the fixed route bus.    For additional information on assistance programs please contact our social worker:   Grier Hock/Abigail Elmore:  [phone]            

## 2017-07-28 ENCOUNTER — Telehealth: Payer: Self-pay | Admitting: *Deleted

## 2017-07-28 NOTE — Telephone Encounter (Signed)
Per staff message, pt contacted regarding low ferritin levels.  Pt agreeable to additional dose of IV iron.  Message sent to scheduler.

## 2017-07-28 NOTE — Telephone Encounter (Signed)
-----   Message from Brunetta Genera, MD sent at 07/28/2017 10:18 AM EDT ----- Delle Reining, plz let Mr Hickox know that his ferritin level did drop from 275--->55 so he does appear to be still having slow GI bleeding. Would recommend additional IV Injectafer weekly x 2 doses to keep his hemoglobin stable.orders placed --please help schedule if he is ok with this. Thanks, GK

## 2017-07-28 NOTE — Progress Notes (Signed)
HEMATOLOGY/ONCOLOGY CLINIC NOTE  Date of Service: .07/21/2017  Patient Care Team: [name], MD as PCP - General (Internal Medicine)   Gastroenterology: Dr. 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 Paulita Fujita showed benign polyps. He was recommended repeat colonoscopy in 3-5 years.. .  Current Treatment IV Iron as needed to maintain ferritin >100 B complex 1 cap po daily B12 500 g daily orally  HISTORY OF PRESENTING ILLNESS:  (Plz see initial consultation for details regarding initial presentation)  INTERVAL HISTORY  James Cooke is here for f/u regarding his iron deficiency anemia. He notes no overt GIB bleeding. He notes that he is feeling good with no fatigue and good energy levels .No other acute new concerns. Not using blood thinners. Labs show a hemoglobin slightly lower at 13.6 from  14.1, however his ferritin level has dropped from 275 to 55 suggesting significant ongoing GI losses likely from his hiatal hernia with Lysbeth Galas ulcers.  MEDICAL HISTORY:  Past Medical History:  Diagnosis Date  . Anemia   . Diabetes mellitus without complication ([place])   . Hyperlipidemia   . Hypertension   . Iron deficiency   . Macular degeneration   . Meningitis     SURGICAL HISTORY: Past Surgical History:  Procedure Laterality Date  . APPENDECTOMY    . COLONOSCOPY    . ESOPHAGOGASTRODUODENOSCOPY    . ESOPHAGOGASTRODUODENOSCOPY (EGD) WITH PROPOFOL N/A 12/10/2015   Procedure: ESOPHAGOGASTRODUODENOSCOPY (EGD) WITH PROPOFOL;  Surgeon: Wilford Corner, MD;  Location: [facility] ENDOSCOPY;  Service: Gastroenterology;  Laterality: N/A;  . GIVENS CAPSULE STUDY N/A 11/10/2015   Procedure: GIVENS CAPSULE STUDY;  Surgeon: Arta Silence, MD;   Location: [facility] ENDOSCOPY;  Service: Endoscopy;  Laterality: N/A;  . HERNIA REPAIR    . TONSILLECTOMY      SOCIAL HISTORY: Social History   Social History  . Marital status: Single    Spouse name: N/A  . Number of children: N/A  . Years of education: N/A   Occupational History  . Not on file.   Social History Main Topics  . Smoking status: Never Smoker  . Smokeless tobacco: Never Used  . Alcohol use No  . Drug use: No  . Sexual activity: No   Other Topics Concern  . Not on file   Social History Narrative  . No narrative on file    FAMILY HISTORY: Family History  Problem Relation Age of Onset  . CAD Mother     ALLERGIES:  has No Known Allergies.  MEDICATIONS:  Current Outpatient Prescriptions  Medication Sig Dispense Refill  . ACCU-CHEK AVIVA PLUS test strip     . amLODipine (NORVASC) 5 MG tablet Take 5 mg by mouth every evening.     Marland Kitchen atorvastatin (LIPITOR) 80 MG tablet Take 80 mg by mouth daily at 6 PM.     . b complex vitamins capsule Take 1 capsule by mouth daily.    . B Complex-C-E-Zn (STRESS-600/ZINC PO) Take 1 tablet by mouth daily.    Marland Kitchen CALCIUM-MAGNESIUM-VITAMIN D ER PO Take by mouth.    . docusate sodium (COLACE) 100 MG capsule Take 2 capsules (200 mg total) by mouth at bedtime as needed for mild constipation or moderate constipation. 60 capsule 0  . Fish Oil-Cholecalciferol (OMEGA-3 +  VITAMIN D3 PO) Take 1 tablet by mouth daily.    Marland Kitchen glimepiride (AMARYL) 2 MG tablet Take 2 mg by mouth daily with breakfast.    . metFORMIN (GLUCOPHAGE) 500 MG tablet Take 500 mg by mouth every evening.     . Multiple Vitamins-Minerals (ICAPS) CAPS Take 1 capsule by mouth daily.    Marland Kitchen omeprazole (PRILOSEC) 20 MG capsule Take 20 mg by mouth as needed.    . pioglitazone (ACTOS) 15 MG tablet      No current facility-administered medications for this visit.    Facility-Administered Medications Ordered in Other Visits  Medication Dose Route Frequency Provider Last Rate Last Dose   . 0.9 %  sodium chloride infusion   Intravenous Once [name], MD      . acetaminophen (TYLENOL) tablet 650 mg  650 mg Oral Once [name], MD      . diphenhydrAMINE (BENADRYL) capsule 25 mg  25 mg Oral Once [name], MD        REVIEW OF SYSTEMS:    10 Point review of Systems was done is negative except as noted above.  PHYSICAL EXAMINATION: ECOG PERFORMANCE STATUS: 2 - Symptomatic, <50% confined to bed  . Vitals:   07/21/17 1015  Weight: 251 lb 3.2 oz (113.9 kg)  Height: 5\' 10"  (1.778 m)   Filed Weights   07/21/17 1015  Weight: 251 lb 3.2 oz (113.9 kg)   .Body mass index is 36.04 kg/m.  GENERAL:alert, in no acute distress and comfortable SKIN: skin color, texture, turgor are normal, no rashes or significant lesions EYES: normal, conjunctiva are pink and non-injected, sclera clear OROPHARYNX:no exudate, no erythema and lips, buccal mucosa, and tongue normal  NECK: supple, no JVD, thyroid normal size, non-tender, without nodularity LYMPH:  no palpable lymphadenopathy in the cervical, axillary or inguinal LUNGS: clear to auscultation with normal respiratory effort HEART: regular rate & rhythm,  no murmurs and no lower extremity edema ABDOMEN: abdomen soft, non-tender, normoactive bowel sounds , no hepatosplenomegaly  Musculoskeletal: no cyanosis of digits and no clubbing  PSYCH: alert & oriented x 3 with fluent speech NEURO: no focal motor/sensory deficits  LABORATORY DATA:  I have reviewed the data as listed  . CBC Latest Ref Rng & Units 07/21/2017 04/14/2017 02/17/2017  WBC 4.0 - 10.3 10e3/uL 5.0 5.1 4.6  Hemoglobin 13.0 - 17.1 g/dL 13.6 14.1 13.3  Hematocrit 38.4 - 49.9 % 41.6 42.9 39.7  Platelets 140 - 400 10e3/uL 204 220 201   . CBC    Component Value Date/Time   WBC 5.0 07/21/2017 0947   WBC 14.5 (H) 02/12/2017 1035   RBC 4.66 07/21/2017 0947   RBC 4.93 02/12/2017 1035   HGB 13.6 07/21/2017 0947   HCT 41.6 07/21/2017 0947   PLT 204 07/21/2017 0947    MCV 89.3 07/21/2017 0947   MCH 29.2 07/21/2017 0947   MCH 29.0 02/12/2017 1035   MCHC 32.7 07/21/2017 0947   MCHC 33.2 02/12/2017 1035   RDW 14.1 07/21/2017 0947   LYMPHSABS 0.6 (L) 07/21/2017 0947   MONOABS 0.5 07/21/2017 0947   EOSABS 0.3 07/21/2017 0947   BASOSABS 0.0 07/21/2017 0947     . CMP Latest Ref Rng & Units 07/21/2017 04/14/2017 02/17/2017  Glucose 70 - 140 mg/dl 91 87 90  BUN 7.0 - 26.0 mg/dL 18.8 16.0 15.2  Creatinine 0.7 - 1.3 mg/dL 1.0 1.1 1.0  Sodium 136 - 145 mEq/L 142 141 140  Potassium 3.5 - 5.1 mEq/L 4.3 4.3 4.1  Chloride 101 - 111 mmol/L - - -  CO2 22 - 29 mEq/L 29 26 23   Calcium 8.4 - 10.4 mg/dL 9.8 9.7 9.3  Total Protein 6.4 - 8.3 g/dL 7.5 7.5 7.3  Total Bilirubin 0.20 - 1.20 mg/dL 0.59 0.71 0.49  Alkaline Phos 40 - 150 U/L 95 101 83  AST 5 - 34 U/L 20 18 23   ALT 0 - 55 U/L 23 23 31    . Lab Results  Component Value Date   IRON 56 07/21/2017   TIBC 350 07/21/2017   IRONPCTSAT 16 (L) 07/21/2017   (Iron and TIBC)  Lab Results  Component Value Date   FERRITIN 55 07/21/2017    RADIOGRAPHIC STUDIES: I have personally reviewed the radiological images as listed and agreed with the findings in the report. No results found.  RADIOGRAPHIC STUDIES: I have personally reviewed the radiological images as listed and agreed with the findings in the report. No results found.  ASSESSMENT & PLAN:   76 year old with   #1 Microcytic hypochromic anemia. Iron deficiency anemia recurrent and likely due to ongoing GI blood losses. Fecal occult blood x 2 was positive on 10/31/2015. The source of GI bleeding thought to be his Ulcers related to his hiatal hernia . Patient has refused surgical option to address his hiatal hernia with ulceration causing recurrent chronic GI bleeds. Platelet function, PT and PTT were within normal limits and suggest against the presence of a hemostatic defect.  Patient's hemoglobin levels Slightly down at 13.6 from 14.1 with an MCV of  89. Ferritin levels are down from 275 with an iron saturation of nearly 30%. To a  ferritin of 55 with an iron saturation of 16%  Plan -No clinical evidence of overt GI bleeding at this time. - Will continue to replace this as needed to maintain ferritin levels >100 and iron saturation of >30 -Based on results available today would recommend additional IV Injectafer 750mg  weekly x 2 doses given his history of significant GI losses which appeared to be ongoing.  -Continue oral B12 and B complex replacement to support accelerated erythropoiesis. -Continue PPI twice a day pre-meals. -absolutely avoid NSAIDs and other blood thinners.   -Continued close follow-up with primary care physician and GI.  Please schedule IV IV weekly x 2 doses RTC with Dr Irene Limbo in 3 months with labs   All of the patients questions were answered to his apparent satisfaction. The patient knows to call the clinic with any problems, questions or concerns.  I spent 15 minutes counseling the patient face to face. The total time spent in the appointment was 20 minutes and more than 50% was on counseling and direct patient cares.    Sullivan Lone MD [place] AAHIVMS [facility] Hematology/Oncology Physician [facility]  (Office):       [phone] (Work cell):  [phone] (Fax):           [phone]

## 2017-08-01 ENCOUNTER — Telehealth: Payer: Self-pay | Admitting: Hematology

## 2017-08-01 NOTE — Telephone Encounter (Signed)
sw pt to confirm iv iron appts 9/17 and 9/24 at 1230 per sch msg

## 2017-08-08 ENCOUNTER — Ambulatory Visit (HOSPITAL_BASED_OUTPATIENT_CLINIC_OR_DEPARTMENT_OTHER): Payer: Medicare Other

## 2017-08-08 ENCOUNTER — Other Ambulatory Visit: Payer: Self-pay

## 2017-08-08 VITALS — BP 144/82 | HR 74 | Temp 97.8°F | Resp 16

## 2017-08-08 DIAGNOSIS — K922 Gastrointestinal hemorrhage, unspecified: Secondary | ICD-10-CM

## 2017-08-08 DIAGNOSIS — D509 Iron deficiency anemia, unspecified: Secondary | ICD-10-CM

## 2017-08-08 DIAGNOSIS — D500 Iron deficiency anemia secondary to blood loss (chronic): Secondary | ICD-10-CM

## 2017-08-08 MED ORDER — SODIUM CHLORIDE 0.9 % IV SOLN
750.0000 mg | Freq: Once | INTRAVENOUS | Status: AC
  Administered 2017-08-08: 750 mg via INTRAVENOUS
  Filled 2017-08-08: qty 15

## 2017-08-15 ENCOUNTER — Ambulatory Visit (HOSPITAL_BASED_OUTPATIENT_CLINIC_OR_DEPARTMENT_OTHER): Payer: Medicare Other

## 2017-08-15 VITALS — BP 153/80 | HR 82 | Temp 98.0°F | Resp 17

## 2017-08-15 DIAGNOSIS — D500 Iron deficiency anemia secondary to blood loss (chronic): Secondary | ICD-10-CM | POA: Diagnosis not present

## 2017-08-15 DIAGNOSIS — K922 Gastrointestinal hemorrhage, unspecified: Secondary | ICD-10-CM

## 2017-08-15 MED ORDER — SODIUM CHLORIDE 0.9 % IV SOLN
750.0000 mg | Freq: Once | INTRAVENOUS | Status: AC
  Administered 2017-08-15: 750 mg via INTRAVENOUS
  Filled 2017-08-15: qty 15

## 2017-08-15 NOTE — Patient Instructions (Signed)
Ferric carboxymaltose injection What is this medicine? FERRIC CARBOXYMALTOSE (ferr-ik car-box-ee-mol-toes) is an iron complex. Iron is used to make healthy red blood cells, which carry oxygen and nutrients throughout the body. This medicine is used to treat anemia in people with chronic kidney disease or people who cannot take iron by mouth. This medicine may be used for other purposes; ask your health care provider or pharmacist if you have questions. COMMON BRAND NAME(S): Injectafer What should I tell my health care provider before I take this medicine? They need to know if you have any of these conditions: -anemia not caused by low iron levels -high levels of iron in the blood -liver disease -an unusual or allergic reaction to iron, other medicines, foods, dyes, or preservatives -pregnant or trying to get pregnant -breast-feeding How should I use this medicine? This medicine is for infusion into a vein. It is given by a health care professional in a hospital or clinic setting. Talk to your pediatrician regarding the use of this medicine in children. Special care may be needed. Overdosage: If you think you have taken too much of this medicine contact a poison control center or emergency room at once. NOTE: This medicine is only for you. Do not share this medicine with others. What if I miss a dose? It is important not to miss your dose. Call your doctor or health care professional if you are unable to keep an appointment. What may interact with this medicine? Do not take this medicine with any of the following medications: -deferoxamine -dimercaprol -other iron products This medicine may also interact with the following medications: -chloramphenicol -deferasirox This list may not describe all possible interactions. Give your health care provider a list of all the medicines, herbs, non-prescription drugs, or dietary supplements you use. Also tell them if you smoke, drink alcohol, or use  illegal drugs. Some items may interact with your medicine. What should I watch for while using this medicine? Visit your doctor or health care professional regularly. Tell your doctor if your symptoms do not start to get better or if they get worse. You may need blood work done while you are taking this medicine. You may need to follow a special diet. Talk to your doctor. Foods that contain iron include: whole grains/cereals, dried fruits, beans, or peas, leafy green vegetables, and organ meats (liver, kidney). What side effects may I notice from receiving this medicine? Side effects that you should report to your doctor or health care professional as soon as possible: -allergic reactions like skin rash, itching or hives, swelling of the face, lips, or tongue -breathing problems -changes in blood pressure -feeling faint or lightheaded, falls -flushing, sweating, or hot feelings Side effects that usually do not require medical attention (report to your doctor or health care professional if they continue or are bothersome): -changes in taste -constipation -dizziness -headache -nausea -pain, redness, or irritation at site where injected -vomiting This list may not describe all possible side effects. Call your doctor for medical advice about side effects. You may report side effects to FDA at 1-800-FDA-1088. Where should I keep my medicine? This drug is given in a hospital or clinic and will not be stored at home. NOTE: This sheet is a summary. It may not cover all possible information. If you have questions about this medicine, talk to your doctor, pharmacist, or health care provider.  2018 Elsevier/Gold Standard (2015-12-11 11:20:47)  

## 2017-10-07 DIAGNOSIS — Z23 Encounter for immunization: Secondary | ICD-10-CM | POA: Diagnosis not present

## 2017-10-14 ENCOUNTER — Telehealth: Payer: Self-pay | Admitting: Hematology

## 2017-10-14 NOTE — Telephone Encounter (Signed)
[place] [facility] - moved appointments for 11/30 to 12/6. Spoke with patient he is aware.

## 2017-10-21 ENCOUNTER — Other Ambulatory Visit: Payer: Self-pay

## 2017-10-21 ENCOUNTER — Ambulatory Visit: Payer: Self-pay | Admitting: Hematology

## 2017-10-27 ENCOUNTER — Encounter: Payer: Self-pay | Admitting: Hematology

## 2017-10-27 ENCOUNTER — Ambulatory Visit (HOSPITAL_BASED_OUTPATIENT_CLINIC_OR_DEPARTMENT_OTHER): Payer: Medicare Other | Admitting: Hematology

## 2017-10-27 ENCOUNTER — Other Ambulatory Visit (HOSPITAL_BASED_OUTPATIENT_CLINIC_OR_DEPARTMENT_OTHER): Payer: Medicare Other

## 2017-10-27 VITALS — BP 142/71 | HR 79 | Temp 98.1°F | Resp 18 | Ht 70.0 in | Wt 245.6 lb

## 2017-10-27 DIAGNOSIS — D500 Iron deficiency anemia secondary to blood loss (chronic): Secondary | ICD-10-CM

## 2017-10-27 DIAGNOSIS — D509 Iron deficiency anemia, unspecified: Secondary | ICD-10-CM

## 2017-10-27 DIAGNOSIS — K922 Gastrointestinal hemorrhage, unspecified: Secondary | ICD-10-CM

## 2017-10-27 LAB — CBC & DIFF AND RETIC
BASO%: 0.6 % (ref 0.0–2.0)
BASOS ABS: 0 10*3/uL (ref 0.0–0.1)
EOS%: 4.3 % (ref 0.0–7.0)
Eosinophils Absolute: 0.2 10*3/uL (ref 0.0–0.5)
HEMATOCRIT: 40.7 % (ref 38.4–49.9)
HGB: 13.3 g/dL (ref 13.0–17.1)
Immature Retic Fract: 3.1 % (ref 3.00–10.60)
LYMPH%: 10.8 % — ABNORMAL LOW (ref 14.0–49.0)
MCH: 29.3 pg (ref 27.2–33.4)
MCHC: 32.7 g/dL (ref 32.0–36.0)
MCV: 89.6 fL (ref 79.3–98.0)
MONO#: 0.3 10*3/uL (ref 0.1–0.9)
MONO%: 6.3 % (ref 0.0–14.0)
NEUT#: 3.6 10*3/uL (ref 1.5–6.5)
NEUT%: 78 % — AB (ref 39.0–75.0)
PLATELETS: 230 10*3/uL (ref 140–400)
RBC: 4.54 10*6/uL (ref 4.20–5.82)
RDW: 14.6 % (ref 11.0–14.6)
Retic %: 0.83 % (ref 0.80–1.80)
Retic Ct Abs: 37.68 10*3/uL (ref 34.80–93.90)
WBC: 4.6 10*3/uL (ref 4.0–10.3)
lymph#: 0.5 10*3/uL — ABNORMAL LOW (ref 0.9–3.3)

## 2017-10-27 LAB — COMPREHENSIVE METABOLIC PANEL
ALBUMIN: 3.5 g/dL (ref 3.5–5.0)
ALK PHOS: 96 U/L (ref 40–150)
ALT: 23 U/L (ref 0–55)
ANION GAP: 11 meq/L (ref 3–11)
AST: 17 U/L (ref 5–34)
BILIRUBIN TOTAL: 0.48 mg/dL (ref 0.20–1.20)
BUN: 24.4 mg/dL (ref 7.0–26.0)
CO2: 18 meq/L — AB (ref 22–29)
CREATININE: 0.9 mg/dL (ref 0.7–1.3)
Calcium: 9.2 mg/dL (ref 8.4–10.4)
Chloride: 108 mEq/L (ref 98–109)
EGFR: >60 mL/min/{1.73_m2} (ref >60)
GLUCOSE: 106 mg/dL (ref 70–140)
Potassium: 4.3 mEq/L (ref 3.5–5.1)
SODIUM: 137 meq/L (ref 136–145)
TOTAL PROTEIN: 7.6 g/dL (ref 6.4–8.3)

## 2017-10-27 LAB — IRON AND TIBC
%SAT: 29 % (ref 20–55)
IRON: 87 ug/dL (ref 42–163)
TIBC: 301 ug/dL (ref 202–409)
UIBC: 214 ug/dL (ref 117–376)

## 2017-10-27 LAB — FERRITIN: FERRITIN: 313 ng/mL (ref 22–316)

## 2017-10-27 NOTE — Progress Notes (Signed)
HEMATOLOGY/ONCOLOGY CLINIC NOTE  Date of Service: 10/27/17  Patient Care Team: [name], MD as PCP - General (Internal Medicine)   Gastroenterology: Dr. 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 Paulita Fujita showed benign polyps. He was recommended repeat colonoscopy in 3-5 years.. .  Current Treatment IV Iron as needed to maintain ferritin >100 B complex 1 cap po daily B12 500 g daily orally  HISTORY OF PRESENTING ILLNESS:  (Plz see initial consultation for details regarding initial presentation)  INTERVAL HISTORY  James Cooke is here for f/u regarding his iron deficiency anemia. Labs show hemoglobin 13.3, ferritin 313. He states he is doing well overall. 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 He states his energy levels have been well and he notes no over GI bleeding.   On review of systems, pt denies black stools, fever, chills, night sweats, abdominal pain and any other accompanying symptoms.  MEDICAL HISTORY:  Past Medical History:  Diagnosis Date  . Anemia   . Diabetes mellitus without complication ([place])   . Hyperlipidemia   . Hypertension   . Iron deficiency   . Macular degeneration   . Meningitis     SURGICAL HISTORY: Past Surgical History:  Procedure Laterality Date  . APPENDECTOMY    . COLONOSCOPY    . ESOPHAGOGASTRODUODENOSCOPY    . ESOPHAGOGASTRODUODENOSCOPY (EGD) WITH PROPOFOL N/A 12/10/2015   Procedure: ESOPHAGOGASTRODUODENOSCOPY (EGD) WITH PROPOFOL;  Surgeon: Wilford Corner, MD;  Location: [facility] ENDOSCOPY;  Service: Gastroenterology;  Laterality: N/A;  . GIVENS CAPSULE STUDY N/A 11/10/2015   Procedure: GIVENS CAPSULE STUDY;  Surgeon: Arta Silence, MD;  Location: [facility] ENDOSCOPY;  Service:  Endoscopy;  Laterality: N/A;  . HERNIA REPAIR    . TONSILLECTOMY      SOCIAL HISTORY: Social History   Socioeconomic History  . Marital status: Single    Spouse name: Not on file  . Number of children: Not on file  . Years of education: Not on file  . Highest education level: Not on file  Social Needs  . Financial resource strain: Not on file  . Food insecurity - worry: Not on file  . Food insecurity - inability: Not on file  . Transportation needs - medical: Not on file  . Transportation needs - non-medical: Not on file  Occupational History  . Not on file  Tobacco Use  . Smoking status: Never Smoker  . Smokeless tobacco: Never Used  Substance and Sexual Activity  . Alcohol use: No    Alcohol/week: 0.0 oz  . Drug use: No  . Sexual activity: No  Other Topics Concern  . Not on file  Social History Narrative  . Not on file    FAMILY HISTORY: Family History  Problem Relation Age of Onset  . CAD Mother     ALLERGIES:  has No Known Allergies.  MEDICATIONS:  Current Outpatient Medications  Medication Sig Dispense Refill  . ACCU-CHEK AVIVA PLUS test strip     . amLODipine (NORVASC) 5 MG tablet Take 5 mg by mouth every evening.     Marland Kitchen atorvastatin (LIPITOR) 80 MG tablet Take 80 mg by mouth daily at 6 PM.     . b complex vitamins capsule Take 1 capsule by mouth daily.    Marland Kitchen  B Complex-C-E-Zn (STRESS-600/ZINC PO) Take 1 tablet by mouth daily.    Marland Kitchen bismuth subsalicylate (PEPTO BISMOL) 262 MG/15ML suspension Take 30 mLs by mouth every 6 (six) hours as needed.    Marland Kitchen CALCIUM-MAGNESIUM-VITAMIN D ER PO Take by mouth.    . docusate sodium (COLACE) 100 MG capsule Take 2 capsules (200 mg total) by mouth at bedtime as needed for mild constipation or moderate constipation. 60 capsule 0  . Fish Oil-Cholecalciferol (OMEGA-3 + VITAMIN D3 PO) Take 1 tablet by mouth daily.    Marland Kitchen glimepiride (AMARYL) 2 MG tablet Take 2 mg by mouth daily with breakfast.    . metFORMIN (GLUCOPHAGE) 500 MG  tablet Take 500 mg by mouth every evening.     . Multiple Vitamins-Minerals (ICAPS) CAPS Take 1 capsule by mouth daily.    Marland Kitchen omeprazole (PRILOSEC) 20 MG capsule Take 20 mg by mouth as needed.    . pioglitazone (ACTOS) 15 MG tablet      No current facility-administered medications for this visit.    Facility-Administered Medications Ordered in Other Visits  Medication Dose Route Frequency Provider Last Rate Last Dose  . 0.9 %  sodium chloride infusion   Intravenous Once [name], MD      . acetaminophen (TYLENOL) tablet 650 mg  650 mg Oral Once [name], MD      . diphenhydrAMINE (BENADRYL) capsule 25 mg  25 mg Oral Once [name], MD        REVIEW OF SYSTEMS:    10 Point review of Systems was done is negative except as noted above.  PHYSICAL EXAMINATION: ECOG PERFORMANCE STATUS: 2 - Symptomatic, <50% confined to bed  . Vitals:   07/21/17 1015  Weight: 251 lb 3.2 oz (113.9 kg)  Height: 5\' 10"  (1.778 m)   Filed Weights   10/27/17 1129  Weight: 245 lb 9.6 oz (111.4 kg)   .Body mass index is 35.24 kg/m.  GENERAL:alert, in no acute distress and comfortable SKIN: skin color, texture, turgor are normal, no rashes or significant lesions EYES: normal, conjunctiva are pink and non-injected, sclera clear OROPHARYNX:no exudate, no erythema and lips, buccal mucosa, and tongue normal  NECK: supple, no JVD, thyroid normal size, non-tender, without nodularity LYMPH:  no palpable lymphadenopathy in the cervical, axillary or inguinal LUNGS: clear to auscultation with normal respiratory effort HEART: regular rate & rhythm,  no murmurs and no lower extremity edema ABDOMEN: abdomen soft, non-tender, normoactive bowel sounds , no hepatosplenomegaly  Musculoskeletal: no cyanosis of digits and no clubbing  PSYCH: alert & oriented x 3 with fluent speech NEURO: no focal motor/sensory deficits  LABORATORY DATA:  I have reviewed the data as listed  . CBC Latest Ref Rng & Units 10/27/2017  07/21/2017 04/14/2017  WBC 4.0 - 10.3 10e3/uL 4.6 5.0 5.1  Hemoglobin 13.0 - 17.1 g/dL 13.3 13.6 14.1  Hematocrit 38.4 - 49.9 % 40.7 41.6 42.9  Platelets 140 - 400 10e3/uL 230 204 220   . CBC    Component Value Date/Time   WBC 4.6 10/27/2017 0958   WBC 14.5 (H) 02/12/2017 1035   RBC 4.54 10/27/2017 0958   RBC 4.93 02/12/2017 1035   HGB 13.3 10/27/2017 0958   HCT 40.7 10/27/2017 0958   PLT 230 10/27/2017 0958   MCV 89.6 10/27/2017 0958   MCH 29.3 10/27/2017 0958   MCH 29.0 02/12/2017 1035   MCHC 32.7 10/27/2017 0958   MCHC 33.2 02/12/2017 1035   RDW 14.6 10/27/2017 0958   LYMPHSABS 0.5 (  L) 10/27/2017 0958   MONOABS 0.3 10/27/2017 0958   EOSABS 0.2 10/27/2017 0958   BASOSABS 0.0 10/27/2017 0958     . CMP Latest Ref Rng & Units 10/27/2017 07/21/2017 04/14/2017  Glucose 70 - 140 mg/dl 106 91 87  BUN 7.0 - 26.0 mg/dL 24.4 18.8 16.0  Creatinine 0.7 - 1.3 mg/dL 0.9 1.0 1.1  Sodium 136 - 145 mEq/L 137 142 141  Potassium 3.5 - 5.1 mEq/L 4.3 4.3 4.3  Chloride 101 - 111 mmol/L - - -  CO2 22 - 29 mEq/L 18(L) 29 26  Calcium 8.4 - 10.4 mg/dL 9.2 9.8 9.7  Total Protein 6.4 - 8.3 g/dL 7.6 7.5 7.5  Total Bilirubin 0.20 - 1.20 mg/dL 0.48 0.59 0.71  Alkaline Phos 40 - 150 U/L 96 95 101  AST 5 - 34 U/L 17 20 18   ALT 0 - 55 U/L 23 23 23    . Lab Results  Component Value Date   IRON 87 10/27/2017   TIBC 301 10/27/2017   IRONPCTSAT 29 10/27/2017   (Iron and TIBC)  Lab Results  Component Value Date   FERRITIN 313 10/27/2017    RADIOGRAPHIC STUDIES: I have personally reviewed the radiological images as listed and agreed with the findings in the report. No results found.  RADIOGRAPHIC STUDIES: I have personally reviewed the radiological images as listed and agreed with the findings in the report. No results found.  ASSESSMENT & PLAN:   75 year old with   #1 Microcytic hypochromic anemia. Iron deficiency anemia recurrent and likely due to ongoing GI blood losses. Fecal occult  blood x 2 was positive on 10/31/2015. The source of GI bleeding thought to be his Ulcers related to his hiatal hernia . Patient has refused surgical option to address his hiatal hernia with ulceration causing recurrent chronic GI bleeds. Platelet function, PT and PTT were within normal limits and suggest against the presence of a hemostatic defect.  Patient's hemoglobin levels  And ferritin are WNL at this time. Plan -No clinical evidence of overt GI bleeding at this time. - Will continue to replace Iron IV as needed to maintain ferritin levels >100 and iron saturation of >30 -Based on results available today would not recommend additional IV Iron at this time -Continue oral B12 and B complex replacement to support accelerated erythropoiesis. -Continue PPI twice a day pre-meals. -absolutely avoid NSAIDs and other blood thinners.  -Continued close follow-up with primary care physician and GI. -Instructed pt to report to ER immediately if he notices GI bleed  RTC with Dr Irene Limbo in 3 months with labs   All of the patients questions were answered to his apparent satisfaction. The patient knows to call the clinic with any problems, questions or concerns.  I spent 15 minutes counseling the patient face to face. The total time spent in the appointment was 20 minutes and more than 50% was on counseling and direct patient cares.    James Lone MD [place] AAHIVMS [facility] Hematology/Oncology Physician [place]  (Office):       [phone] (Work cell):  [phone] (Fax):           [phone]  This document serves as a record of services personally performed by James Lone, MD. It was created on his behalf by Alean Rinne, a trained medical scribe. The creation of this record is based on the scribe's personal observations and the provider's statements to them.   .I have reviewed the above documentation for accuracy and completeness,  and I agree with the above. Brunetta Genera  MD MS

## 2017-10-27 NOTE — Patient Instructions (Signed)
Thank you for choosing [facility] to provide your oncology and hematology care.  To afford each patient quality time with our providers, please arrive 30 minutes before your scheduled appointment time.  If you arrive late for your appointment, you may be asked to reschedule.  We strive to give you quality time with our providers, and arriving late affects you and other patients whose appointments are after yours.  If you are a no show for multiple scheduled visits, you may be dismissed from the clinic at the providers discretion.   Again, thank you for choosing [facility], our hope is that these requests will decrease the amount of time that you wait before being seen by our physicians.  ______________________________________________________________________ Should you have questions after your visit to the [facility], please contact our office at [phone] between the hours of 8:30 and 4:30 p.m.    Voicemails left after 4:30p.m will not be returned until the following business day.   For prescription refill requests, please have your pharmacy contact us directly.  Please also try to allow 48 hours for prescription requests.   Please contact the scheduling department for questions regarding scheduling.  For scheduling of procedures such as PET scans, CT scans, MRI, Ultrasound, etc please contact central scheduling at [phone].   Resources For Cancer Patients and Caregivers:  American Cancer Society:  800-227-2345  Can help patients locate various types of support and financial assistance Cancer Care: 1-800-813-HOPE (4673) Provides financial assistance, online support groups, medication/co-pay assistance.   [place] DSS:  336-641-3447 Where to apply for food stamps, Medicaid, and utility assistance Medicare Rights Center: 800-333-4114 Helps people with Medicare understand their rights and benefits, navigate the Medicare system, and secure the  quality healthcare they deserve SCAT: 336-333-6589 [employer] [employer]'s shared-ride transportation service for eligible riders who have a disability that prevents them from riding the fixed route bus.   For additional information on assistance programs please contact our social worker:   Grier Hock/Abigail Elmore:  [phone] 

## 2017-11-03 ENCOUNTER — Telehealth: Payer: Self-pay | Admitting: Hematology

## 2017-11-03 NOTE — Telephone Encounter (Signed)
Patient is scheduled for 01/2018 per 12/6 los.

## 2017-12-26 DIAGNOSIS — E119 Type 2 diabetes mellitus without complications: Secondary | ICD-10-CM | POA: Diagnosis not present

## 2017-12-26 DIAGNOSIS — I10 Essential (primary) hypertension: Secondary | ICD-10-CM | POA: Diagnosis not present

## 2017-12-27 DIAGNOSIS — H40051 Ocular hypertension, right eye: Secondary | ICD-10-CM | POA: Diagnosis not present

## 2017-12-27 DIAGNOSIS — H40013 Open angle with borderline findings, low risk, bilateral: Secondary | ICD-10-CM | POA: Diagnosis not present

## 2018-01-02 DIAGNOSIS — D509 Iron deficiency anemia, unspecified: Secondary | ICD-10-CM | POA: Diagnosis not present

## 2018-01-02 DIAGNOSIS — I10 Essential (primary) hypertension: Secondary | ICD-10-CM | POA: Diagnosis not present

## 2018-01-02 DIAGNOSIS — Z125 Encounter for screening for malignant neoplasm of prostate: Secondary | ICD-10-CM | POA: Diagnosis not present

## 2018-01-02 DIAGNOSIS — E119 Type 2 diabetes mellitus without complications: Secondary | ICD-10-CM | POA: Diagnosis not present

## 2018-01-02 DIAGNOSIS — E7800 Pure hypercholesterolemia, unspecified: Secondary | ICD-10-CM | POA: Diagnosis not present

## 2018-01-26 NOTE — Progress Notes (Signed)
HEMATOLOGY/ONCOLOGY CLINIC NOTE  Date of Service: 01/27/18  Patient Care Team: [name], MD as PCP - General (Internal Medicine)   Gastroenterology: Dr. Paulita Fujita  CHIEF COMPLAINTS/  Continue mx of Iron deficiency Anemia due to ongoing GI bleeding.  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 Paulita Fujita showed benign polyps. He was recommended repeat colonoscopy in 3-5 years.. .  Current Treatment IV Iron as needed to maintain ferritin >100 B complex 1 cap po daily B12 500 g daily orally  HISTORY OF PRESENTING ILLNESS:  (Plz see initial consultation for details regarding initial presentation)  INTERVAL HISTORY  James Cooke is here for f/u regarding his iron deficiency anemia. The patient's last visit with [place] was on 10/27/17. The pt reports that he is doing very well and had a great holiday season.   The patient's last IV iron was 6 months ago in September 2018. He has not had any overt blood in the stools. He continues taking his Omeprazole prn and was recommended to talk with PCP/GI about being on the PPI BID given ongoing drop in ferritin levels suggesting ongoing GI bleeding.  Lab results today (01/27/18) of CBC, CMP, and Reticulocytes is as follows: all values are WNL except for RDW at 15, Lymphs Abs at 0.8k. Ferritin 01/27/18 is WNL at 53, down from 313 three months ago.  On review of systems, pt reports good energy levels, and denies bleeding, blood in the stools, abdominal pains, and any other symptoms.    MEDICAL HISTORY:  Past Medical History:  Diagnosis Date  . Anemia   . Diabetes mellitus without complication ([place])   . Hyperlipidemia   . Hypertension   . Iron deficiency   . Macular degeneration   . Meningitis     SURGICAL HISTORY: Past Surgical History:  Procedure Laterality Date  . APPENDECTOMY     . COLONOSCOPY    . ESOPHAGOGASTRODUODENOSCOPY    . ESOPHAGOGASTRODUODENOSCOPY (EGD) WITH PROPOFOL N/A 12/10/2015   Procedure: ESOPHAGOGASTRODUODENOSCOPY (EGD) WITH PROPOFOL;  Surgeon: Wilford Corner, MD;  Location: [facility] ENDOSCOPY;  Service: Gastroenterology;  Laterality: N/A;  . GIVENS CAPSULE STUDY N/A 11/10/2015   Procedure: GIVENS CAPSULE STUDY;  Surgeon: Arta Silence, MD;  Location: [facility] ENDOSCOPY;  Service: Endoscopy;  Laterality: N/A;  . HERNIA REPAIR    . TONSILLECTOMY      SOCIAL HISTORY: Social History   Socioeconomic History  . Marital status: Single    Spouse name: Not on file  . Number of children: Not on file  . Years of education: Not on file  . Highest education level: Not on file  Social Needs  . Financial resource strain: Not on file  . Food insecurity - worry: Not on file  . Food insecurity - inability: Not on file  . Transportation needs - medical: Not on file  . Transportation needs - non-medical: Not on file  Occupational History  . Not on file  Tobacco Use  . Smoking status: Never Smoker  . Smokeless tobacco: Never Used  Substance and Sexual Activity  . Alcohol use: No    Alcohol/week: 0.0 oz  . Drug use: No  . Sexual activity: No  Other Topics Concern  . Not on file  Social History Narrative  . Not on file    FAMILY HISTORY: Family History  Problem Relation Age of  Onset  . CAD Mother     ALLERGIES:  has No Known Allergies.  MEDICATIONS:  Current Outpatient Medications  Medication Sig Dispense Refill  . ACCU-CHEK AVIVA PLUS test strip     . amLODipine (NORVASC) 5 MG tablet Take 5 mg by mouth every evening.     Marland Kitchen atorvastatin (LIPITOR) 80 MG tablet Take 80 mg by mouth daily at 6 PM.     . b complex vitamins capsule Take 1 capsule by mouth daily.    . B Complex-C-E-Zn (STRESS-600/ZINC PO) Take 1 tablet by mouth daily.    Marland Kitchen bismuth subsalicylate (PEPTO BISMOL) 262 MG/15ML suspension Take 30 mLs by mouth every 6 (six) hours as needed.    Marland Kitchen  CALCIUM-MAGNESIUM-VITAMIN D ER PO Take by mouth.    . docusate sodium (COLACE) 100 MG capsule Take 2 capsules (200 mg total) by mouth at bedtime as needed for mild constipation or moderate constipation. 60 capsule 0  . Fish Oil-Cholecalciferol (OMEGA-3 + VITAMIN D3 PO) Take 1 tablet by mouth daily.    Marland Kitchen glimepiride (AMARYL) 2 MG tablet Take 2 mg by mouth daily with breakfast.    . metFORMIN (GLUCOPHAGE) 500 MG tablet Take 500 mg by mouth every evening.     . Multiple Vitamins-Minerals (ICAPS) CAPS Take 1 capsule by mouth daily.    Marland Kitchen omeprazole (PRILOSEC) 20 MG capsule Take 20 mg by mouth as needed.    . pioglitazone (ACTOS) 15 MG tablet      No current facility-administered medications for this visit.    Facility-Administered Medications Ordered in Other Visits  Medication Dose Route Frequency Provider Last Rate Last Dose  . 0.9 %  sodium chloride infusion   Intravenous Once [name], MD      . acetaminophen (TYLENOL) tablet 650 mg  650 mg Oral Once [name], MD      . diphenhydrAMINE (BENADRYL) capsule 25 mg  25 mg Oral Once [name], MD        REVIEW OF SYSTEMS:    .10 Point review of Systems was done is negative except as noted above.   PHYSICAL EXAMINATION: ECOG PERFORMANCE STATUS: 2 - Symptomatic, <50% confined to bed  . Vitals:   07/21/17 1015  Weight: 251 lb 3.2 oz (113.9 kg)  Height: 5\' 10"  (1.778 m)   Filed Weights   01/27/18 1036  Weight: 249 lb 14.4 oz (113.4 kg)   .Body mass index is 35.86 kg/m.  Marland Kitchen GENERAL:alert, in no acute distress and comfortable SKIN: no acute rashes, no significant lesions EYES: conjunctiva are pink and non-injected, sclera anicteric OROPHARYNX: MMM, no exudates, no oropharyngeal erythema or ulceration NECK: supple, no JVD LYMPH:  no palpable lymphadenopathy in the cervical, axillary or inguinal regions LUNGS: clear to auscultation b/l with normal respiratory effort HEART: regular rate & rhythm ABDOMEN:  normoactive bowel sounds  , non tender, not distended. Extremity: no pedal edema PSYCH: alert & oriented x 3 with fluent speech NEURO: no focal motor/sensory deficits    LABORATORY DATA:  I have reviewed the data as listed  . CBC Latest Ref Rng & Units 01/27/2018 10/27/2017 07/21/2017  WBC 4.0 - 10.3 K/uL 6.7 4.6 5.0  Hemoglobin 13.0 - 17.1 g/dL - 13.3 13.6  Hematocrit 38.4 - 49.9 % 40.6 40.7 41.6  Platelets 140 - 400 K/uL 260 230 204   . CBC    Component Value Date/Time   WBC 6.7 01/27/2018 0930   WBC 4.6 10/27/2017 0958   WBC 14.5 (H) 02/12/2017  1035   RBC 4.52 01/27/2018 0930   RBC 4.52 01/27/2018 0930   HGB 13.3 10/27/2017 0958   HCT 40.6 01/27/2018 0930   HCT 40.7 10/27/2017 0958   PLT 260 01/27/2018 0930   PLT 230 10/27/2017 0958   MCV 89.8 01/27/2018 0930   MCV 89.6 10/27/2017 0958   MCH 28.8 01/27/2018 0930   MCHC 32.0 01/27/2018 0930   RDW 15.0 (H) 01/27/2018 0930   RDW 14.6 10/27/2017 0958   LYMPHSABS 0.8 (L) 01/27/2018 0930   LYMPHSABS 0.5 (L) 10/27/2017 0958   MONOABS 0.7 01/27/2018 0930   MONOABS 0.3 10/27/2017 0958   EOSABS 0.3 01/27/2018 0930   EOSABS 0.2 10/27/2017 0958   BASOSABS 0.0 01/27/2018 0930   BASOSABS 0.0 10/27/2017 0958     . CMP Latest Ref Rng & Units 01/27/2018 10/27/2017 07/21/2017  Glucose 70 - 140 mg/dL 95 106 91  BUN 7 - 26 mg/dL 15 24.4 18.8  Creatinine 0.70 - 1.30 mg/dL 0.94 0.9 1.0  Sodium 136 - 145 mmol/L 142 137 142  Potassium 3.5 - 5.1 mmol/L 4.4 4.3 4.3  Chloride 98 - 109 mmol/L 105 - -  CO2 22 - 29 mmol/L 27 18(L) 29  Calcium 8.4 - 10.4 mg/dL 9.4 9.2 9.8  Total Protein 6.4 - 8.3 g/dL 7.2 7.6 7.5  Total Bilirubin 0.2 - 1.2 mg/dL 0.6 0.48 0.59  Alkaline Phos 40 - 150 U/L 99 96 95  AST 5 - 34 U/L 16 17 20   ALT 0 - 55 U/L 17 23 23    . Lab Results  Component Value Date   IRON 106 01/27/2018   TIBC 338 01/27/2018   IRONPCTSAT 31 (L) 01/27/2018   (Iron and TIBC)  Lab Results  Component Value Date   FERRITIN 53 01/27/2018       RADIOGRAPHIC STUDIES: I have personally reviewed the radiological images as listed and agreed with the findings in the report. No results found.  RADIOGRAPHIC STUDIES: I have personally reviewed the radiological images as listed and agreed with the findings in the report. No results found.  ASSESSMENT & PLAN:   77 year old male with   #1 Microcytic hypochromic anemia. Iron deficiency anemia recurrent and likely due to ongoing GI blood losses.    The source of GI bleeding thought to be his Ulcers related to his hiatal hernia . Patient has refused surgical option to address his hiatal hernia with ulceration causing recurrent chronic GI bleeds. Platelet function, PT and PTT were within normal limits and suggest against the presence of a hemostatic defect.  Plan -No clinical evidence of overt GI bleeding at this time, howevere with significant ongoing recurrent drop in ferritin---suggestive of ongoing slow GI bleeding. - Will continue to replace Iron IV as needed to maintain ferritin levels >100 and iron saturation of >30 -Continue oral B12 and B complex replacement to support accelerated erythropoiesis. -Continue PPI twice a day pre-meals. -will setup for IV INjectafer 750mg  weekly x 2 in light of drop in ferritin levels to 53. -absolutely avoid NSAIDs and other blood thinners.  -Continued close follow-up with primary care physician and GI. -Instructed pt to report to ER immediately if he notices GI bleed  IV Injectafer weekly x 2 doses -- starting ASAP RTC with Dr Irene Limbo with labs in 3 months    All of the patients questions were answered to his apparent satisfaction. The patient knows to call the clinic with any problems, questions or concerns.  . The total time spent in the  appointment was 15 minutes and more than 50% was on counseling and direct patient cares.      Sullivan Lone MD [place] AAHIVMS [facility] Hematology/Oncology Physician [place]  (Office):        [phone] (Work cell):  [phone] (Fax):           [phone]  This document serves as a record of services personally performed by Sullivan Lone, MD. It was created on his behalf by Baldwin [place], a trained medical scribe. The creation of this record is based on the scribe's personal observations and the provider's statements to them.   .I have reviewed the above documentation for accuracy and completeness, and I agree with the above. Brunetta Genera MD MS

## 2018-01-27 ENCOUNTER — Inpatient Hospital Stay (HOSPITAL_BASED_OUTPATIENT_CLINIC_OR_DEPARTMENT_OTHER): Payer: Medicare Other | Admitting: Hematology

## 2018-01-27 ENCOUNTER — Telehealth: Payer: Self-pay | Admitting: Hematology

## 2018-01-27 ENCOUNTER — Inpatient Hospital Stay: Payer: Medicare Other | Attending: Hematology

## 2018-01-27 ENCOUNTER — Encounter: Payer: Self-pay | Admitting: Hematology

## 2018-01-27 VITALS — BP 152/67 | HR 93 | Temp 98.6°F | Resp 18 | Ht 70.0 in | Wt 249.9 lb

## 2018-01-27 DIAGNOSIS — E785 Hyperlipidemia, unspecified: Secondary | ICD-10-CM | POA: Diagnosis not present

## 2018-01-27 DIAGNOSIS — I10 Essential (primary) hypertension: Secondary | ICD-10-CM | POA: Insufficient documentation

## 2018-01-27 DIAGNOSIS — K922 Gastrointestinal hemorrhage, unspecified: Secondary | ICD-10-CM

## 2018-01-27 DIAGNOSIS — K259 Gastric ulcer, unspecified as acute or chronic, without hemorrhage or perforation: Secondary | ICD-10-CM

## 2018-01-27 DIAGNOSIS — Z79899 Other long term (current) drug therapy: Secondary | ICD-10-CM

## 2018-01-27 DIAGNOSIS — K449 Diaphragmatic hernia without obstruction or gangrene: Secondary | ICD-10-CM | POA: Insufficient documentation

## 2018-01-27 DIAGNOSIS — D500 Iron deficiency anemia secondary to blood loss (chronic): Secondary | ICD-10-CM | POA: Insufficient documentation

## 2018-01-27 DIAGNOSIS — E538 Deficiency of other specified B group vitamins: Secondary | ICD-10-CM

## 2018-01-27 DIAGNOSIS — Z7984 Long term (current) use of oral hypoglycemic drugs: Secondary | ICD-10-CM

## 2018-01-27 LAB — CBC WITH DIFFERENTIAL (CANCER CENTER ONLY)
BASOS ABS: 0 10*3/uL (ref 0.0–0.1)
Basophils Relative: 1 %
Eosinophils Absolute: 0.3 10*3/uL (ref 0.0–0.5)
Eosinophils Relative: 4 %
HEMATOCRIT: 40.6 % (ref 38.4–49.9)
Hemoglobin: 13 g/dL (ref 13.0–17.1)
LYMPHS ABS: 0.8 10*3/uL — AB (ref 0.9–3.3)
LYMPHS PCT: 12 %
MCH: 28.8 pg (ref 27.2–33.4)
MCHC: 32 g/dL (ref 32.0–36.0)
MCV: 89.8 fL (ref 79.3–98.0)
MONO ABS: 0.7 10*3/uL (ref 0.1–0.9)
Monocytes Relative: 10 %
Neutro Abs: 4.9 10*3/uL (ref 1.5–6.5)
Neutrophils Relative %: 73 %
Platelet Count: 260 10*3/uL (ref 140–400)
RBC: 4.52 MIL/uL (ref 4.20–5.82)
RDW: 15 % — ABNORMAL HIGH (ref 11.0–14.6)
WBC: 6.7 10*3/uL (ref 4.0–10.3)

## 2018-01-27 LAB — COMPREHENSIVE METABOLIC PANEL
ALBUMIN: 3.6 g/dL (ref 3.5–5.0)
ALT: 17 U/L (ref 0–55)
AST: 16 U/L (ref 5–34)
Alkaline Phosphatase: 99 U/L (ref 40–150)
Anion gap: 10 (ref 3–11)
BILIRUBIN TOTAL: 0.6 mg/dL (ref 0.2–1.2)
BUN: 15 mg/dL (ref 7–26)
CHLORIDE: 105 mmol/L (ref 98–109)
CO2: 27 mmol/L (ref 22–29)
Calcium: 9.4 mg/dL (ref 8.4–10.4)
Creatinine, Ser: 0.94 mg/dL (ref 0.70–1.30)
GFR calc Af Amer: >60 mL/min (ref >60)
GFR calc non Af Amer: >60 mL/min (ref >60)
GLUCOSE: 95 mg/dL (ref 70–140)
POTASSIUM: 4.4 mmol/L (ref 3.5–5.1)
Sodium: 142 mmol/L (ref 136–145)
TOTAL PROTEIN: 7.2 g/dL (ref 6.4–8.3)

## 2018-01-27 LAB — FERRITIN: FERRITIN: 53 ng/mL (ref 22–316)

## 2018-01-27 LAB — RETICULOCYTES
RBC.: 4.52 MIL/uL (ref 4.20–5.82)
RETIC COUNT ABSOLUTE: 72.3 10*3/uL (ref 34.8–93.9)
Retic Ct Pct: 1.6 % (ref 0.8–1.8)

## 2018-01-27 LAB — IRON AND TIBC
IRON: 106 ug/dL (ref 42–163)
SATURATION RATIOS: 31 % — AB (ref 42–163)
TIBC: 338 ug/dL (ref 202–409)
UIBC: 232 ug/dL

## 2018-01-27 NOTE — Patient Instructions (Signed)
Thank you for choosing [facility] to provide your oncology and hematology care.  To afford each patient quality time with our providers, please arrive 30 minutes before your scheduled appointment time.  If you arrive late for your appointment, you may be asked to reschedule.  We strive to give you quality time with our providers, and arriving late affects you and other patients whose appointments are after yours.   If you are a no show for multiple scheduled visits, you may be dismissed from the clinic at the providers discretion.    Again, thank you for choosing [facility], our hope is that these requests will decrease the amount of time that you wait before being seen by our physicians.  ______________________________________________________________________  Should you have questions after your visit to the [facility], please contact our office at [phone] between the hours of 8:30 and 4:30 p.m.    Voicemails left after 4:30p.m will not be returned until the following business day.    For prescription refill requests, please have your pharmacy contact us directly.  Please also try to allow 48 hours for prescription requests.    Please contact the scheduling department for questions regarding scheduling.  For scheduling of procedures such as PET scans, CT scans, MRI, Ultrasound, etc please contact central scheduling at [phone].    Resources For Cancer Patients and Caregivers:   Oncolink.org:  A wonderful resource for patients and healthcare providers for information regarding your disease, ways to tract your treatment, what to expect, etc.     American Cancer Society:  800-227-2345  Can help patients locate various types of support and financial assistance  Cancer Care: 1-800-813-HOPE (4673) Provides financial assistance, online support groups, medication/co-pay assistance.    [place] DSS:  336-641-3447 Where to apply for food  stamps, Medicaid, and utility assistance  Medicare Rights Center: 800-333-4114 Helps people with Medicare understand their rights and benefits, navigate the Medicare system, and secure the quality healthcare they deserve  SCAT: 336-333-6589 [place] [employer]'s shared-ride transportation service for eligible riders who have a disability that prevents them from riding the fixed route bus.    For additional information on assistance programs please contact our social worker:   Grier Hock/Abigail Elmore:  [phone]            

## 2018-01-27 NOTE — Telephone Encounter (Signed)
Gave patient avs and calendar with appts per 3/8 los.

## 2018-01-30 ENCOUNTER — Inpatient Hospital Stay: Payer: Medicare Other

## 2018-01-30 VITALS — BP 150/75 | HR 77 | Temp 98.2°F | Resp 18

## 2018-01-30 DIAGNOSIS — K259 Gastric ulcer, unspecified as acute or chronic, without hemorrhage or perforation: Secondary | ICD-10-CM | POA: Diagnosis not present

## 2018-01-30 DIAGNOSIS — D500 Iron deficiency anemia secondary to blood loss (chronic): Secondary | ICD-10-CM

## 2018-01-30 DIAGNOSIS — Z79899 Other long term (current) drug therapy: Secondary | ICD-10-CM | POA: Diagnosis not present

## 2018-01-30 DIAGNOSIS — E785 Hyperlipidemia, unspecified: Secondary | ICD-10-CM | POA: Diagnosis not present

## 2018-01-30 DIAGNOSIS — I10 Essential (primary) hypertension: Secondary | ICD-10-CM | POA: Diagnosis not present

## 2018-01-30 DIAGNOSIS — Z7984 Long term (current) use of oral hypoglycemic drugs: Secondary | ICD-10-CM | POA: Diagnosis not present

## 2018-01-30 DIAGNOSIS — K922 Gastrointestinal hemorrhage, unspecified: Secondary | ICD-10-CM

## 2018-01-30 DIAGNOSIS — K449 Diaphragmatic hernia without obstruction or gangrene: Secondary | ICD-10-CM | POA: Diagnosis not present

## 2018-01-30 MED ORDER — SODIUM CHLORIDE 0.9 % IV SOLN
750.0000 mg | Freq: Once | INTRAVENOUS | Status: AC
  Administered 2018-01-30: 750 mg via INTRAVENOUS
  Filled 2018-01-30: qty 15

## 2018-01-30 NOTE — Patient Instructions (Signed)
Ferric carboxymaltose injection What is this medicine? FERRIC CARBOXYMALTOSE (ferr-ik car-box-ee-mol-toes) is an iron complex. Iron is used to make healthy red blood cells, which carry oxygen and nutrients throughout the body. This medicine is used to treat anemia in people with chronic kidney disease or people who cannot take iron by mouth. This medicine may be used for other purposes; ask your health care provider or pharmacist if you have questions. COMMON BRAND NAME(S): Injectafer What should I tell my health care provider before I take this medicine? They need to know if you have any of these conditions: -anemia not caused by low iron levels -high levels of iron in the blood -liver disease -an unusual or allergic reaction to iron, other medicines, foods, dyes, or preservatives -pregnant or trying to get pregnant -breast-feeding How should I use this medicine? This medicine is for infusion into a vein. It is given by a health care professional in a hospital or clinic setting. Talk to your pediatrician regarding the use of this medicine in children. Special care may be needed. Overdosage: If you think you have taken too much of this medicine contact a poison control center or emergency room at once. NOTE: This medicine is only for you. Do not share this medicine with others. What if I miss a dose? It is important not to miss your dose. Call your doctor or health care professional if you are unable to keep an appointment. What may interact with this medicine? Do not take this medicine with any of the following medications: -deferoxamine -dimercaprol -other iron products This medicine may also interact with the following medications: -chloramphenicol -deferasirox This list may not describe all possible interactions. Give your health care provider a list of all the medicines, herbs, non-prescription drugs, or dietary supplements you use. Also tell them if you smoke, drink alcohol, or use  illegal drugs. Some items may interact with your medicine. What should I watch for while using this medicine? Visit your doctor or health care professional regularly. Tell your doctor if your symptoms do not start to get better or if they get worse. You may need blood work done while you are taking this medicine. You may need to follow a special diet. Talk to your doctor. Foods that contain iron include: whole grains/cereals, dried fruits, beans, or peas, leafy green vegetables, and organ meats (liver, kidney). What side effects may I notice from receiving this medicine? Side effects that you should report to your doctor or health care professional as soon as possible: -allergic reactions like skin rash, itching or hives, swelling of the face, lips, or tongue -breathing problems -changes in blood pressure -feeling faint or lightheaded, falls -flushing, sweating, or hot feelings Side effects that usually do not require medical attention (report to your doctor or health care professional if they continue or are bothersome): -changes in taste -constipation -dizziness -headache -nausea -pain, redness, or irritation at site where injected -vomiting This list may not describe all possible side effects. Call your doctor for medical advice about side effects. You may report side effects to FDA at 1-800-FDA-1088. Where should I keep my medicine? This drug is given in a hospital or clinic and will not be stored at home. NOTE: This sheet is a summary. It may not cover all possible information. If you have questions about this medicine, talk to your doctor, pharmacist, or health care provider.  2018 Elsevier/Gold Standard (2015-12-11 11:20:47)  

## 2018-01-31 ENCOUNTER — Telehealth: Payer: Self-pay

## 2018-01-31 NOTE — Telephone Encounter (Signed)
Completed by Alli. Per 3/8 in basket

## 2018-02-06 ENCOUNTER — Inpatient Hospital Stay: Payer: Medicare Other

## 2018-02-06 VITALS — BP 159/83 | HR 101 | Temp 97.9°F | Resp 18

## 2018-02-06 DIAGNOSIS — K922 Gastrointestinal hemorrhage, unspecified: Secondary | ICD-10-CM

## 2018-02-06 DIAGNOSIS — E785 Hyperlipidemia, unspecified: Secondary | ICD-10-CM | POA: Diagnosis not present

## 2018-02-06 DIAGNOSIS — Z7984 Long term (current) use of oral hypoglycemic drugs: Secondary | ICD-10-CM | POA: Diagnosis not present

## 2018-02-06 DIAGNOSIS — K449 Diaphragmatic hernia without obstruction or gangrene: Secondary | ICD-10-CM | POA: Diagnosis not present

## 2018-02-06 DIAGNOSIS — D500 Iron deficiency anemia secondary to blood loss (chronic): Secondary | ICD-10-CM | POA: Diagnosis not present

## 2018-02-06 DIAGNOSIS — I10 Essential (primary) hypertension: Secondary | ICD-10-CM | POA: Diagnosis not present

## 2018-02-06 DIAGNOSIS — Z79899 Other long term (current) drug therapy: Secondary | ICD-10-CM | POA: Diagnosis not present

## 2018-02-06 DIAGNOSIS — K259 Gastric ulcer, unspecified as acute or chronic, without hemorrhage or perforation: Secondary | ICD-10-CM | POA: Diagnosis not present

## 2018-02-06 MED ORDER — SODIUM CHLORIDE 0.9 % IV SOLN
750.0000 mg | Freq: Once | INTRAVENOUS | Status: AC
  Administered 2018-02-06: 750 mg via INTRAVENOUS
  Filled 2018-02-06: qty 15

## 2018-02-06 NOTE — Patient Instructions (Signed)
Ferric carboxymaltose injection What is this medicine? FERRIC CARBOXYMALTOSE (ferr-ik car-box-ee-mol-toes) is an iron complex. Iron is used to make healthy red blood cells, which carry oxygen and nutrients throughout the body. This medicine is used to treat anemia in people with chronic kidney disease or people who cannot take iron by mouth. This medicine may be used for other purposes; ask your health care provider or pharmacist if you have questions. COMMON BRAND NAME(S): Injectafer What should I tell my health care provider before I take this medicine? They need to know if you have any of these conditions: -anemia not caused by low iron levels -high levels of iron in the blood -liver disease -an unusual or allergic reaction to iron, other medicines, foods, dyes, or preservatives -pregnant or trying to get pregnant -breast-feeding How should I use this medicine? This medicine is for infusion into a vein. It is given by a health care professional in a hospital or clinic setting. Talk to your pediatrician regarding the use of this medicine in children. Special care may be needed. Overdosage: If you think you have taken too much of this medicine contact a poison control center or emergency room at once. NOTE: This medicine is only for you. Do not share this medicine with others. What if I miss a dose? It is important not to miss your dose. Call your doctor or health care professional if you are unable to keep an appointment. What may interact with this medicine? Do not take this medicine with any of the following medications: -deferoxamine -dimercaprol -other iron products This medicine may also interact with the following medications: -chloramphenicol -deferasirox This list may not describe all possible interactions. Give your health care provider a list of all the medicines, herbs, non-prescription drugs, or dietary supplements you use. Also tell them if you smoke, drink alcohol, or use  illegal drugs. Some items may interact with your medicine. What should I watch for while using this medicine? Visit your doctor or health care professional regularly. Tell your doctor if your symptoms do not start to get better or if they get worse. You may need blood work done while you are taking this medicine. You may need to follow a special diet. Talk to your doctor. Foods that contain iron include: whole grains/cereals, dried fruits, beans, or peas, leafy green vegetables, and organ meats (liver, kidney). What side effects may I notice from receiving this medicine? Side effects that you should report to your doctor or health care professional as soon as possible: -allergic reactions like skin rash, itching or hives, swelling of the face, lips, or tongue -breathing problems -changes in blood pressure -feeling faint or lightheaded, falls -flushing, sweating, or hot feelings Side effects that usually do not require medical attention (report to your doctor or health care professional if they continue or are bothersome): -changes in taste -constipation -dizziness -headache -nausea -pain, redness, or irritation at site where injected -vomiting This list may not describe all possible side effects. Call your doctor for medical advice about side effects. You may report side effects to FDA at 1-800-FDA-1088. Where should I keep my medicine? This drug is given in a hospital or clinic and will not be stored at home. NOTE: This sheet is a summary. It may not cover all possible information. If you have questions about this medicine, talk to your doctor, pharmacist, or health care provider.  2018 Elsevier/Gold Standard (2015-12-11 11:20:47)  

## 2018-03-23 DIAGNOSIS — D509 Iron deficiency anemia, unspecified: Secondary | ICD-10-CM | POA: Diagnosis not present

## 2018-03-23 DIAGNOSIS — K449 Diaphragmatic hernia without obstruction or gangrene: Secondary | ICD-10-CM | POA: Diagnosis not present

## 2018-05-01 ENCOUNTER — Other Ambulatory Visit: Payer: Self-pay

## 2018-05-01 ENCOUNTER — Ambulatory Visit: Payer: Self-pay | Admitting: Hematology

## 2018-05-01 NOTE — Progress Notes (Signed)
HEMATOLOGY/ONCOLOGY CLINIC NOTE  Date of Service: 05/02/18  Patient Care Team: [name], MD as PCP - General (Internal Medicine)   Gastroenterology: Dr. Paulita Fujita  CHIEF COMPLAINTS  Continue mx of Iron deficiency Anemia due to ongoing GI bleeding.  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 Paulita Fujita showed benign polyps. He was recommended repeat colonoscopy in 3-5 years.. .  Current Treatment IV Iron as needed to maintain ferritin >100 B complex 1 cap po daily B12 500 g daily orally  HISTORY OF PRESENTING ILLNESS:  (Plz see initial consultation for details regarding initial presentation)  INTERVAL HISTORY  James Cooke is here for f/u regarding his iron deficiency anemia. The patient's last visit with [place] was on 01/27/18. The pt reports that he is doing well overall and is looking forward to seeing his sister soon.   The pt reports that he saw his GI Dr Paulita Fujita on 03/23/18. He notes that there were no new developments from his GI clinic visit.   Lab results today (05/02/18) of CBC, CMP, and Reticulocytes is as follows: all values are WNL except for Lymphs Abs at 0.5k. Ferritin 05/02/18 is 184 with 22% iron saturation.  Vitamin B12 05/02/18 is 574 Iron/TIBC 05/02/18 shows a 22% saturation ratio  On review of systems, pt reports good energy levels, normal bowel movements, and denies blood in the stools, black stools, abdominal pains, and any other symptoms.   MEDICAL HISTORY:  Past Medical History:  Diagnosis Date  . Anemia   . Diabetes mellitus without complication ([place])   . Hyperlipidemia   . Hypertension   . Iron deficiency   . Macular degeneration   . Meningitis     SURGICAL HISTORY: Past Surgical History:  Procedure Laterality Date  . APPENDECTOMY    . COLONOSCOPY    . ESOPHAGOGASTRODUODENOSCOPY    .  ESOPHAGOGASTRODUODENOSCOPY (EGD) WITH PROPOFOL N/A 12/10/2015   Procedure: ESOPHAGOGASTRODUODENOSCOPY (EGD) WITH PROPOFOL;  Surgeon: Wilford Corner, MD;  Location: [facility] ENDOSCOPY;  Service: Gastroenterology;  Laterality: N/A;  . GIVENS CAPSULE STUDY N/A 11/10/2015   Procedure: GIVENS CAPSULE STUDY;  Surgeon: Arta Silence, MD;  Location[facility] ENDOSCOPY;  Service: Endoscopy;  Laterality: N/A;  . HERNIA REPAIR    . TONSILLECTOMY      SOCIAL HISTORY: Social History   Socioeconomic History  . Marital status: Single    Spouse name: Not on file  . Number of children: Not on file  . Years of education: Not on file  . Highest education level: Not on file  Occupational History  . Not on file  Social Needs  . Financial resource strain: Not on file  . Food insecurity:    Worry: Not on file    Inability: Not on file  . Transportation needs:    Medical: Not on file    Non-medical: Not on file  Tobacco Use  . Smoking status: Never Smoker  . Smokeless tobacco: Never Used  Substance and Sexual Activity  . Alcohol use: No    Alcohol/week: 0.0 oz  . Drug use: No  . Sexual activity: Never  Lifestyle  . Physical activity:    Days per week: Not on file    Minutes per session: Not on file  . Stress: Not on file  Relationships  . Social connections:    Talks on phone: Not  on file    Gets together: Not on file    Attends religious service: Not on file    Active member of club or organization: Not on file    Attends meetings of clubs or organizations: Not on file    Relationship status: Not on file  . Intimate partner violence:    Fear of current or ex partner: Not on file    Emotionally abused: Not on file    Physically abused: Not on file    Forced sexual activity: Not on file  Other Topics Concern  . Not on file  Social History Narrative  . Not on file    FAMILY HISTORY: Family History  Problem Relation Age of Onset  . CAD Mother     ALLERGIES:  has No Known  Allergies.  MEDICATIONS:  Current Outpatient Medications  Medication Sig Dispense Refill  . ACCU-CHEK AVIVA PLUS test strip     . amLODipine (NORVASC) 5 MG tablet Take 5 mg by mouth every evening.     Marland Kitchen atorvastatin (LIPITOR) 80 MG tablet Take 80 mg by mouth daily at 6 PM.     . b complex vitamins capsule Take 1 capsule by mouth daily.    . B Complex-C-E-Zn (STRESS-600/ZINC PO) Take 1 tablet by mouth daily.    Marland Kitchen bismuth subsalicylate (PEPTO BISMOL) 262 MG/15ML suspension Take 30 mLs by mouth every 6 (six) hours as needed.    Marland Kitchen CALCIUM-MAGNESIUM-VITAMIN D ER PO Take by mouth.    . docusate sodium (COLACE) 100 MG capsule Take 2 capsules (200 mg total) by mouth at bedtime as needed for mild constipation or moderate constipation. 60 capsule 0  . Fish Oil-Cholecalciferol (OMEGA-3 + VITAMIN D3 PO) Take 1 tablet by mouth daily.    Marland Kitchen glimepiride (AMARYL) 2 MG tablet Take 2 mg by mouth daily with breakfast.    . metFORMIN (GLUCOPHAGE) 500 MG tablet Take 500 mg by mouth every evening.     . Multiple Vitamins-Minerals (ICAPS) CAPS Take 1 capsule by mouth daily.    Marland Kitchen omeprazole (PRILOSEC) 20 MG capsule Take 20 mg by mouth daily.     . pioglitazone (ACTOS) 15 MG tablet      No current facility-administered medications for this visit.    Facility-Administered Medications Ordered in Other Visits  Medication Dose Route Frequency Provider Last Rate Last Dose  . 0.9 %  sodium chloride infusion   Intravenous Once [name], MD      . acetaminophen (TYLENOL) tablet 650 mg  650 mg Oral Once [name], MD      . diphenhydrAMINE (BENADRYL) capsule 25 mg  25 mg Oral Once [name], MD        REVIEW OF SYSTEMS:    A 10+ POINT REVIEW OF SYSTEMS WAS OBTAINED including neurology, dermatology, psychiatry, cardiac, respiratory, lymph, extremities, GI, GU, Musculoskeletal, constitutional, breasts, reproductive, HEENT.  All pertinent positives are noted in the HPI.  All others are negative.  PHYSICAL  EXAMINATION: ECOG PERFORMANCE STATUS: 2 - Symptomatic, <50% confined to bed  . Vitals:   07/21/17 1015  Weight: 251 lb 3.2 oz (113.9 kg)  Height: 5\' 10"  (1.778 m)   Filed Weights   05/02/18 1014  Weight: 247 lb 3.2 oz (112.1 kg)   .Body mass index is 35.47 kg/m.  GENERAL:alert, in no acute distress and comfortable SKIN: no acute rashes, no significant lesions EYES: conjunctiva are pink and non-injected, sclera anicteric OROPHARYNX: MMM, no exudates, no oropharyngeal erythema or ulceration NECK:  supple, no JVD LYMPH:  no palpable lymphadenopathy in the cervical, axillary or inguinal regions LUNGS: clear to auscultation b/l with normal respiratory effort HEART: regular rate & rhythm ABDOMEN:  normoactive bowel sounds , non tender, not distended. Extremity: no pedal edema PSYCH: alert & oriented x 3 with fluent speech NEURO: no focal motor/sensory deficits   LABORATORY DATA:  I have reviewed the data as listed  . CBC Latest Ref Rng & Units 05/02/2018 01/27/2018 10/27/2017  WBC 4.0 - 10.3 K/uL 5.4 6.7 4.6  Hemoglobin 13.0 - 17.1 g/dL 13.9 13.0 13.3  Hematocrit 38.4 - 49.9 % 42.1 40.6 40.7  Platelets 140 - 400 K/uL 200 260 230   . CBC    Component Value Date/Time   WBC 5.4 05/02/2018 0919   RBC 4.78 05/02/2018 0919   RBC 4.78 05/02/2018 0919   HGB 13.9 05/02/2018 0919   HGB 13.0 01/27/2018 0930   HGB 13.3 10/27/2017 0958   HCT 42.1 05/02/2018 0919   HCT 40.7 10/27/2017 0958   PLT 200 05/02/2018 0919   PLT 260 01/27/2018 0930   PLT 230 10/27/2017 0958   MCV 88.1 05/02/2018 0919   MCV 89.6 10/27/2017 0958   MCH 29.1 05/02/2018 0919   MCHC 33.0 05/02/2018 0919   RDW 14.3 05/02/2018 0919   RDW 14.6 10/27/2017 0958   LYMPHSABS 0.5 (L) 05/02/2018 0919   LYMPHSABS 0.5 (L) 10/27/2017 0958   MONOABS 0.6 05/02/2018 0919   MONOABS 0.3 10/27/2017 0958   EOSABS 0.2 05/02/2018 0919   EOSABS 0.2 10/27/2017 0958   BASOSABS 0.0 05/02/2018 0919   BASOSABS 0.0 10/27/2017 0958      . CMP Latest Ref Rng & Units 05/02/2018 01/27/2018 10/27/2017  Glucose 70 - 140 mg/dL 80 95 106  BUN 7 - 26 mg/dL 23 15 24.4  Creatinine 0.70 - 1.30 mg/dL 1.15 0.94 0.9  Sodium 136 - 145 mmol/L 141 142 137  Potassium 3.5 - 5.1 mmol/L 4.5 4.4 4.3  Chloride 98 - 109 mmol/L 106 105 -  CO2 22 - 29 mmol/L 24 27 18(L)  Calcium 8.4 - 10.4 mg/dL 9.5 9.4 9.2  Total Protein 6.4 - 8.3 g/dL 7.5 7.2 7.6  Total Bilirubin 0.2 - 1.2 mg/dL 0.6 0.6 0.48  Alkaline Phos 40 - 150 U/L 98 99 96  AST 5 - 34 U/L 18 16 17   ALT 0 - 55 U/L 15 17 23    . Lab Results  Component Value Date   IRON 74 05/02/2018   TIBC 331 05/02/2018   IRONPCTSAT 22 (L) 05/02/2018   (Iron and TIBC)  Lab Results  Component Value Date   FERRITIN 184 05/02/2018     RADIOGRAPHIC STUDIES: I have personally reviewed the radiological images as listed and agreed with the findings in the report. No results found.  RADIOGRAPHIC STUDIES: I have personally reviewed the radiological images as listed and agreed with the findings in the report. No results found.  ASSESSMENT & PLAN:   77 year old male with   #1 Microcytic hypochromic anemia. Iron deficiency anemia recurrent and likely due to ongoing GI blood losses.    The source of GI bleeding thought to be his Ulcers related to his hiatal hernia . Patient has refused surgical option to address his hiatal hernia with ulceration causing recurrent chronic GI bleeds. Platelet function, PT and PTT were within normal limits and suggest against the presence of a hemostatic defect.  Plan -No clinical evidence of overt GI bleeding at this time -Continue oral B12  and B complex replacement to support accelerated erythropoiesis. -Continue PPI twice a day pre-meals. -absolutely avoid NSAIDs and other blood thinners.  -Continued close follow-up with primary care physician and GI. -Instructed pt to report to ER immediately if he notices GI bleed -Discussed pt labwork today, 05/02/18; no  anemia with Hgb at 13.9 -Ferritin is adequate at 184 - no overt indication of IV Iron at this time -Continue IV Injectafer if ferritin <100 -Will see pt back in 3 months    RTC with Dr Irene Limbo with labs in 3 months    All of the patients questions were answered to his apparent satisfaction. The patient knows to call the clinic with any problems, questions or concerns.  The total time spent in the appt was 20 minutes and more than 50% was on counseling and direct patient cares.   James Lone MD [place] AAHIVMS [facility] Hematology/Oncology Physician [facility]  (Office):       [phone] (Work cell):  [phone] (Fax):           [phone]  I, Baldwin Jamaica, am acting as a scribe for Dr Irene Limbo.   .I have reviewed the above documentation for accuracy and completeness, and I agree with the above. Brunetta Genera MD

## 2018-05-02 ENCOUNTER — Inpatient Hospital Stay: Payer: Medicare Other

## 2018-05-02 ENCOUNTER — Encounter: Payer: Self-pay | Admitting: Hematology

## 2018-05-02 ENCOUNTER — Inpatient Hospital Stay: Payer: Medicare Other | Attending: Hematology | Admitting: Hematology

## 2018-05-02 ENCOUNTER — Telehealth: Payer: Self-pay | Admitting: Hematology

## 2018-05-02 VITALS — BP 149/79 | HR 68 | Temp 98.6°F | Resp 20 | Ht 70.0 in | Wt 247.2 lb

## 2018-05-02 DIAGNOSIS — D500 Iron deficiency anemia secondary to blood loss (chronic): Secondary | ICD-10-CM

## 2018-05-02 DIAGNOSIS — E538 Deficiency of other specified B group vitamins: Secondary | ICD-10-CM

## 2018-05-02 DIAGNOSIS — Z7984 Long term (current) use of oral hypoglycemic drugs: Secondary | ICD-10-CM | POA: Insufficient documentation

## 2018-05-02 DIAGNOSIS — Z79899 Other long term (current) drug therapy: Secondary | ICD-10-CM | POA: Diagnosis not present

## 2018-05-02 DIAGNOSIS — K259 Gastric ulcer, unspecified as acute or chronic, without hemorrhage or perforation: Secondary | ICD-10-CM | POA: Insufficient documentation

## 2018-05-02 DIAGNOSIS — K254 Chronic or unspecified gastric ulcer with hemorrhage: Secondary | ICD-10-CM | POA: Diagnosis not present

## 2018-05-02 DIAGNOSIS — I10 Essential (primary) hypertension: Secondary | ICD-10-CM | POA: Diagnosis not present

## 2018-05-02 DIAGNOSIS — K449 Diaphragmatic hernia without obstruction or gangrene: Secondary | ICD-10-CM | POA: Diagnosis not present

## 2018-05-02 DIAGNOSIS — K922 Gastrointestinal hemorrhage, unspecified: Secondary | ICD-10-CM | POA: Diagnosis not present

## 2018-05-02 DIAGNOSIS — E785 Hyperlipidemia, unspecified: Secondary | ICD-10-CM

## 2018-05-02 DIAGNOSIS — E119 Type 2 diabetes mellitus without complications: Secondary | ICD-10-CM | POA: Diagnosis not present

## 2018-05-02 LAB — CMP (CANCER CENTER ONLY)
ALBUMIN: 4 g/dL (ref 3.5–5.0)
ALT: 15 U/L (ref 0–55)
AST: 18 U/L (ref 5–34)
Alkaline Phosphatase: 98 U/L (ref 40–150)
Anion gap: 11 (ref 3–11)
BILIRUBIN TOTAL: 0.6 mg/dL (ref 0.2–1.2)
BUN: 23 mg/dL (ref 7–26)
CHLORIDE: 106 mmol/L (ref 98–109)
CO2: 24 mmol/L (ref 22–29)
CREATININE: 1.15 mg/dL (ref 0.70–1.30)
Calcium: 9.5 mg/dL (ref 8.4–10.4)
GFR, EST NON AFRICAN AMERICAN: 60 mL/min — AB (ref >60)
GFR, Est AFR Am: >60 mL/min (ref >60)
GLUCOSE: 80 mg/dL (ref 70–140)
POTASSIUM: 4.5 mmol/L (ref 3.5–5.1)
Sodium: 141 mmol/L (ref 136–145)
Total Protein: 7.5 g/dL (ref 6.4–8.3)

## 2018-05-02 LAB — CBC WITH DIFFERENTIAL/PLATELET
Basophils Absolute: 0 10*3/uL (ref 0.0–0.1)
Basophils Relative: 1 %
EOS ABS: 0.2 10*3/uL (ref 0.0–0.5)
EOS PCT: 3 %
HCT: 42.1 % (ref 38.4–49.9)
Hemoglobin: 13.9 g/dL (ref 13.0–17.1)
LYMPHS ABS: 0.5 10*3/uL — AB (ref 0.9–3.3)
LYMPHS PCT: 9 %
MCH: 29.1 pg (ref 27.2–33.4)
MCHC: 33 g/dL (ref 32.0–36.0)
MCV: 88.1 fL (ref 79.3–98.0)
MONO ABS: 0.6 10*3/uL (ref 0.1–0.9)
Monocytes Relative: 10 %
Neutro Abs: 4.2 10*3/uL (ref 1.5–6.5)
Neutrophils Relative %: 77 %
PLATELETS: 200 10*3/uL (ref 140–400)
RBC: 4.78 MIL/uL (ref 4.20–5.82)
RDW: 14.3 % (ref 11.0–14.6)
WBC: 5.4 10*3/uL (ref 4.0–10.3)

## 2018-05-02 LAB — RETICULOCYTES
RBC.: 4.78 MIL/uL (ref 4.20–5.82)
RETIC CT PCT: 0.9 % (ref 0.8–1.8)
Retic Count, Absolute: 43 10*3/uL (ref 34.8–93.9)

## 2018-05-02 LAB — VITAMIN B12: VITAMIN B 12: 574 pg/mL (ref 180–914)

## 2018-05-02 LAB — IRON AND TIBC
IRON: 74 ug/dL (ref 42–163)
Saturation Ratios: 22 % — ABNORMAL LOW (ref 42–163)
TIBC: 331 ug/dL (ref 202–409)
UIBC: 257 ug/dL

## 2018-05-02 LAB — FERRITIN: FERRITIN: 184 ng/mL (ref 22–316)

## 2018-05-02 NOTE — Telephone Encounter (Signed)
Appointments scheduled letter/calendar printed per 6/11 los

## 2018-06-22 DIAGNOSIS — H40051 Ocular hypertension, right eye: Secondary | ICD-10-CM | POA: Diagnosis not present

## 2018-06-22 DIAGNOSIS — H40013 Open angle with borderline findings, low risk, bilateral: Secondary | ICD-10-CM | POA: Diagnosis not present

## 2018-06-22 DIAGNOSIS — H353132 Nonexudative age-related macular degeneration, bilateral, intermediate dry stage: Secondary | ICD-10-CM | POA: Diagnosis not present

## 2018-06-22 DIAGNOSIS — E119 Type 2 diabetes mellitus without complications: Secondary | ICD-10-CM | POA: Diagnosis not present

## 2018-07-06 DIAGNOSIS — I10 Essential (primary) hypertension: Secondary | ICD-10-CM | POA: Diagnosis not present

## 2018-07-06 DIAGNOSIS — E119 Type 2 diabetes mellitus without complications: Secondary | ICD-10-CM | POA: Diagnosis not present

## 2018-07-06 DIAGNOSIS — D509 Iron deficiency anemia, unspecified: Secondary | ICD-10-CM | POA: Diagnosis not present

## 2018-07-13 DIAGNOSIS — E7800 Pure hypercholesterolemia, unspecified: Secondary | ICD-10-CM | POA: Diagnosis not present

## 2018-07-13 DIAGNOSIS — Z0000 Encounter for general adult medical examination without abnormal findings: Secondary | ICD-10-CM | POA: Diagnosis not present

## 2018-07-13 DIAGNOSIS — E119 Type 2 diabetes mellitus without complications: Secondary | ICD-10-CM | POA: Diagnosis not present

## 2018-07-13 DIAGNOSIS — E611 Iron deficiency: Secondary | ICD-10-CM | POA: Diagnosis not present

## 2018-07-13 DIAGNOSIS — I10 Essential (primary) hypertension: Secondary | ICD-10-CM | POA: Diagnosis not present

## 2018-08-02 ENCOUNTER — Inpatient Hospital Stay (HOSPITAL_BASED_OUTPATIENT_CLINIC_OR_DEPARTMENT_OTHER): Payer: Medicare Other | Admitting: Hematology

## 2018-08-02 ENCOUNTER — Telehealth: Payer: Self-pay

## 2018-08-02 ENCOUNTER — Inpatient Hospital Stay: Payer: Medicare Other | Attending: Hematology

## 2018-08-02 VITALS — BP 173/79 | HR 84 | Temp 97.7°F | Resp 18 | Ht 70.0 in | Wt 258.8 lb

## 2018-08-02 DIAGNOSIS — E785 Hyperlipidemia, unspecified: Secondary | ICD-10-CM

## 2018-08-02 DIAGNOSIS — D500 Iron deficiency anemia secondary to blood loss (chronic): Secondary | ICD-10-CM | POA: Diagnosis not present

## 2018-08-02 DIAGNOSIS — Z79899 Other long term (current) drug therapy: Secondary | ICD-10-CM | POA: Insufficient documentation

## 2018-08-02 DIAGNOSIS — I10 Essential (primary) hypertension: Secondary | ICD-10-CM

## 2018-08-02 DIAGNOSIS — Z7984 Long term (current) use of oral hypoglycemic drugs: Secondary | ICD-10-CM

## 2018-08-02 DIAGNOSIS — K922 Gastrointestinal hemorrhage, unspecified: Secondary | ICD-10-CM | POA: Insufficient documentation

## 2018-08-02 DIAGNOSIS — E119 Type 2 diabetes mellitus without complications: Secondary | ICD-10-CM | POA: Insufficient documentation

## 2018-08-02 DIAGNOSIS — E538 Deficiency of other specified B group vitamins: Secondary | ICD-10-CM

## 2018-08-02 LAB — CBC WITH DIFFERENTIAL/PLATELET
BASOS ABS: 0 10*3/uL (ref 0.0–0.1)
Basophils Relative: 1 %
Eosinophils Absolute: 0.3 10*3/uL (ref 0.0–0.5)
Eosinophils Relative: 5 %
HCT: 42.8 % (ref 38.4–49.9)
HEMOGLOBIN: 13.8 g/dL (ref 13.0–17.1)
Lymphocytes Relative: 13 %
Lymphs Abs: 0.7 10*3/uL — ABNORMAL LOW (ref 0.9–3.3)
MCH: 28.5 pg (ref 27.2–33.4)
MCHC: 32.2 g/dL (ref 32.0–36.0)
MCV: 88.2 fL (ref 79.3–98.0)
MONOS PCT: 10 %
Monocytes Absolute: 0.6 10*3/uL (ref 0.1–0.9)
NEUTROS ABS: 4 10*3/uL (ref 1.5–6.5)
NEUTROS PCT: 71 %
Platelets: 214 10*3/uL (ref 140–400)
RBC: 4.85 MIL/uL (ref 4.20–5.82)
RDW: 15.3 % — ABNORMAL HIGH (ref 11.0–14.6)
WBC: 5.6 10*3/uL (ref 4.0–10.3)

## 2018-08-02 LAB — IRON AND TIBC
IRON: 78 ug/dL (ref 42–163)
Saturation Ratios: 22 % — ABNORMAL LOW (ref 42–163)
TIBC: 355 ug/dL (ref 202–409)
UIBC: 277 ug/dL

## 2018-08-02 LAB — RETICULOCYTES
RBC.: 4.85 MIL/uL (ref 4.20–5.82)
RETIC COUNT ABSOLUTE: 63.1 10*3/uL (ref 34.8–93.9)
Retic Ct Pct: 1.3 % (ref 0.8–1.8)

## 2018-08-02 LAB — FERRITIN: Ferritin: 95 ng/mL (ref 24–336)

## 2018-08-02 NOTE — Progress Notes (Signed)
HEMATOLOGY/ONCOLOGY CLINIC NOTE  Date of Service: 08/02/18  Patient Care Team: [name], MD as PCP - General (Internal Medicine)   Gastroenterology: Dr. Paulita Fujita  CHIEF COMPLAINTS  Continue mx of Iron deficiency Anemia due to ongoing GI bleeding.  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 Paulita Fujita showed benign polyps. He was recommended repeat colonoscopy in 3-5 years.. .  Current Treatment IV Iron as needed to maintain ferritin >100 B complex 1 cap po daily B12 500 g daily orally  HISTORY OF PRESENTING ILLNESS:  (Plz see initial consultation for details regarding initial presentation)  INTERVAL HISTORY  [name] is here for f/u regarding his iron deficiency anemia. The patient's last visit with [place] was on 05/02/18. The pt reports that he is doing well overall.   The pt reports that he is very excited about his recent physical examination with his PCP. He notes that his energy levels have been stable.   Lab results today (08/02/18) of CBC w/diff and Reticulocytes is as follows: all values are WNL except for RDW at 15.3, Lymphs abs at 700. 08/02/18 Ferritin at 95 08/02/18 Iron and TIBC shows a 22% saturation ratio.  On review of systems, pt reports good energy levels, and denies dizziness, light headedness, any other symptoms.   MEDICAL HISTORY:  Past Medical History:  Diagnosis Date  . Anemia   . Diabetes mellitus without complication ([place])   . Hyperlipidemia   . Hypertension   . Iron deficiency   . Macular degeneration   . Meningitis     SURGICAL HISTORY: Past Surgical History:  Procedure Laterality Date  . APPENDECTOMY    . COLONOSCOPY    . ESOPHAGOGASTRODUODENOSCOPY    . ESOPHAGOGASTRODUODENOSCOPY (EGD) WITH PROPOFOL N/A 12/10/2015   Procedure: ESOPHAGOGASTRODUODENOSCOPY (EGD) WITH PROPOFOL;   Surgeon: Wilford Corner, MD;  Location: [facility] ENDOSCOPY;  Service: Gastroenterology;  Laterality: N/A;  . GIVENS CAPSULE STUDY N/A 11/10/2015   Procedure: GIVENS CAPSULE STUDY;  Surgeon: Arta Silence, MD;  Location: [facility] ENDOSCOPY;  Service: Endoscopy;  Laterality: N/A;  . HERNIA REPAIR    . TONSILLECTOMY      SOCIAL HISTORY: Social History   Socioeconomic History  . Marital status: Single    Spouse name: Not on file  . Number of children: Not on file  . Years of education: Not on file  . Highest education level: Not on file  Occupational History  . Not on file  Social Needs  . Financial resource strain: Not on file  . Food insecurity:    Worry: Not on file    Inability: Not on file  . Transportation needs:    Medical: Not on file    Non-medical: Not on file  Tobacco Use  . Smoking status: Never Smoker  . Smokeless tobacco: Never Used  Substance and Sexual Activity  . Alcohol use: No    Alcohol/week: 0.0 standard drinks  . Drug use: No  . Sexual activity: Never  Lifestyle  . Physical activity:    Days per week: Not on file    Minutes per session: Not on file  . Stress: Not on file  Relationships  . Social connections:    Talks on phone: Not on file    Gets together: Not on file    Attends religious service: Not on file  Active member of club or organization: Not on file    Attends meetings of clubs or organizations: Not on file    Relationship status: Not on file  . Intimate partner violence:    Fear of current or ex partner: Not on file    Emotionally abused: Not on file    Physically abused: Not on file    Forced sexual activity: Not on file  Other Topics Concern  . Not on file  Social History Narrative  . Not on file    FAMILY HISTORY: Family History  Problem Relation Age of Onset  . CAD Mother     ALLERGIES:  has No Known Allergies.  MEDICATIONS:  Current Outpatient Medications  Medication Sig Dispense Refill  . ACCU-CHEK AVIVA PLUS test strip      . amLODipine (NORVASC) 5 MG tablet Take 5 mg by mouth every evening.     Marland Kitchen atorvastatin (LIPITOR) 80 MG tablet Take 80 mg by mouth daily at 6 PM.     . b complex vitamins capsule Take 1 capsule by mouth daily.    . B Complex-C-E-Zn (STRESS-600/ZINC PO) Take 1 tablet by mouth daily.    Marland Kitchen bismuth subsalicylate (PEPTO BISMOL) 262 MG/15ML suspension Take 30 mLs by mouth every 6 (six) hours as needed.    Marland Kitchen CALCIUM-MAGNESIUM-VITAMIN D ER PO Take by mouth.    . docusate sodium (COLACE) 100 MG capsule Take 2 capsules (200 mg total) by mouth at bedtime as needed for mild constipation or moderate constipation. 60 capsule 0  . Fish Oil-Cholecalciferol (OMEGA-3 + VITAMIN D3 PO) Take 1 tablet by mouth daily.    Marland Kitchen glimepiride (AMARYL) 2 MG tablet Take 2 mg by mouth daily with breakfast.    . metFORMIN (GLUCOPHAGE) 500 MG tablet Take 500 mg by mouth every evening.     . Multiple Vitamins-Minerals (ICAPS) CAPS Take 1 capsule by mouth daily.    Marland Kitchen omeprazole (PRILOSEC) 20 MG capsule Take 20 mg by mouth daily.     . pioglitazone (ACTOS) 15 MG tablet      No current facility-administered medications for this visit.    Facility-Administered Medications Ordered in Other Visits  Medication Dose Route Frequency Provider Last Rate Last Dose  . 0.9 %  sodium chloride infusion   Intravenous Once [name], MD      . acetaminophen (TYLENOL) tablet 650 mg  650 mg Oral Once [name], MD      . diphenhydrAMINE (BENADRYL) capsule 25 mg  25 mg Oral Once [name], MD        REVIEW OF SYSTEMS:    A 10+ POINT REVIEW OF SYSTEMS WAS OBTAINED including neurology, dermatology, psychiatry, cardiac, respiratory, lymph, extremities, GI, GU, Musculoskeletal, constitutional, breasts, reproductive, HEENT.  All pertinent positives are noted in the HPI.  All others are negative.   PHYSICAL EXAMINATION: ECOG PERFORMANCE STATUS: 2 - Symptomatic, <50% confined to bed  . Vitals:   07/21/17 1015  Weight: 251 lb 3.2 oz (113.9  kg)  Height: 5\' 10"  (1.778 m)   Filed Weights   08/02/18 1133  Weight: 258 lb 12.8 oz (117.4 kg)   .Body mass index is 37.13 kg/m.  GENERAL:alert, in no acute distress and comfortable SKIN: no acute rashes, no significant lesions EYES: conjunctiva are pink and non-injected, sclera anicteric OROPHARYNX: MMM, no exudates, no oropharyngeal erythema or ulceration NECK: supple, no JVD LYMPH:  no palpable lymphadenopathy in the cervical, axillary or inguinal regions LUNGS: clear to auscultation b/l with  normal respiratory effort HEART: regular rate & rhythm ABDOMEN:  normoactive bowel sounds , non tender, not distended. No palpable hepatosplenomegaly.  Extremity: no pedal edema PSYCH: alert & oriented x 3 with fluent speech NEURO: no focal motor/sensory deficits   LABORATORY DATA:  I have reviewed the data as listed  . CBC Latest Ref Rng & Units 08/02/2018 05/02/2018 01/27/2018  WBC 4.0 - 10.3 K/uL 5.6 5.4 6.7  Hemoglobin 13.0 - 17.1 g/dL 13.8 13.9 13.0  Hematocrit 38.4 - 49.9 % 42.8 42.1 40.6  Platelets 140 - 400 K/uL 214 200 260   . CBC    Component Value Date/Time   WBC 5.6 08/02/2018 0943   RBC 4.85 08/02/2018 0943   RBC 4.85 08/02/2018 0943   HGB 13.8 08/02/2018 0943   HGB 13.0 01/27/2018 0930   HGB 13.3 10/27/2017 0958   HCT 42.8 08/02/2018 0943   HCT 40.7 10/27/2017 0958   PLT 214 08/02/2018 0943   PLT 260 01/27/2018 0930   PLT 230 10/27/2017 0958   MCV 88.2 08/02/2018 0943   MCV 89.6 10/27/2017 0958   MCH 28.5 08/02/2018 0943   MCHC 32.2 08/02/2018 0943   RDW 15.3 (H) 08/02/2018 0943   RDW 14.6 10/27/2017 0958   LYMPHSABS 0.7 (L) 08/02/2018 0943   LYMPHSABS 0.5 (L) 10/27/2017 0958   MONOABS 0.6 08/02/2018 0943   MONOABS 0.3 10/27/2017 0958   EOSABS 0.3 08/02/2018 0943   EOSABS 0.2 10/27/2017 0958   BASOSABS 0.0 08/02/2018 0943   BASOSABS 0.0 10/27/2017 0958     . CMP Latest Ref Rng & Units 05/02/2018 01/27/2018 10/27/2017  Glucose 70 - 140 mg/dL 80 95 106    BUN 7 - 26 mg/dL 23 15 24.4  Creatinine 0.70 - 1.30 mg/dL 1.15 0.94 0.9  Sodium 136 - 145 mmol/L 141 142 137  Potassium 3.5 - 5.1 mmol/L 4.5 4.4 4.3  Chloride 98 - 109 mmol/L 106 105 -  CO2 22 - 29 mmol/L 24 27 18(L)  Calcium 8.4 - 10.4 mg/dL 9.5 9.4 9.2  Total Protein 6.4 - 8.3 g/dL 7.5 7.2 7.6  Total Bilirubin 0.2 - 1.2 mg/dL 0.6 0.6 0.48  Alkaline Phos 40 - 150 U/L 98 99 96  AST 5 - 34 U/L 18 16 17   ALT 0 - 55 U/L 15 17 23    . Lab Results  Component Value Date   IRON 78 08/02/2018   TIBC 355 08/02/2018   IRONPCTSAT 22 (L) 08/02/2018   (Iron and TIBC)  Lab Results  Component Value Date   FERRITIN 95 08/02/2018     RADIOGRAPHIC STUDIES: I have personally reviewed the radiological images as listed and agreed with the findings in the report. No results found.  RADIOGRAPHIC STUDIES: I have personally reviewed the radiological images as listed and agreed with the findings in the report. No results found.  ASSESSMENT & PLAN:   77 y.o. male with   #1 Microcytic hypochromic anemia. Iron deficiency anemia recurrent and likely due to ongoing GI blood losses.    The source of GI bleeding thought to be his Ulcers related to his hiatal hernia . Patient has refused surgical option to address his hiatal hernia with ulceration causing recurrent chronic GI bleeds. Platelet function, PT and PTT were within normal limits and suggest against the presence of a hemostatic defect.  Plan -No clinical evidence of overt GI bleeding at this time -Continue oral B12 and B complex replacement to support accelerated erythropoiesis. -Continue PPI twice a day pre-meals. -  absolutely avoid NSAIDs and other blood thinners.  -Continued close follow-up with primary care physician and GI. -Discussed pt labwork today, 08/02/18; HGB normal at 13.8 with MCV at 88.2. Ferritin at 95. A 22% saturation ratio.  -Goal of Ferritin >100  -Will order two doses of IV Injectafer -Recommended that pt repeat labs  again with PCP in 3-4 months -Will see the pt back in 6 months, sooner if any new concerns    IV Injectafer weekly x 2 doses RTC with Dr Irene Limbo in 6 months with labs    All of the patients questions were answered to his apparent satisfaction. The patient knows to call the clinic with any problems, questions or concerns.  The total time spent in the appt was 20 minutes and more than 50% was on counseling and direct patient cares.   Sullivan Lone MD [place] AAHIVMS [facility] Hematology/Oncology Physician [facility]  (Office):       [phone] (Work cell):  [phone] (Fax):           [phone]  I, Baldwin Jamaica, am acting as a scribe for Dr. Irene Limbo  .I have reviewed the above documentation for accuracy and completeness, and I agree with the above. Brunetta Genera MD

## 2018-08-02 NOTE — Telephone Encounter (Signed)
Confirmed appointments with patient and will be mailing him direction, a letter, and also enclosed a calender. Per 9/11 los

## 2018-08-10 ENCOUNTER — Ambulatory Visit (HOSPITAL_COMMUNITY)
Admission: RE | Admit: 2018-08-10 | Discharge: 2018-08-10 | Disposition: A | Discharge status: Home / Self Care | Payer: Medicare Other | Source: Ambulatory Visit | Attending: Hematology | Admitting: Hematology

## 2018-08-10 DIAGNOSIS — D509 Iron deficiency anemia, unspecified: Secondary | ICD-10-CM | POA: Insufficient documentation

## 2018-08-10 MED ORDER — SODIUM CHLORIDE 0.9 % IV SOLN
750.0000 mg | Freq: Once | INTRAVENOUS | Status: AC
  Administered 2018-08-10: 750 mg via INTRAVENOUS
  Filled 2018-08-10: qty 15

## 2018-08-10 MED ORDER — SODIUM CHLORIDE 0.9 % IV SOLN
INTRAVENOUS | Status: DC
  Administered 2018-08-10: 09:00:00 via INTRAVENOUS

## 2018-08-10 NOTE — Progress Notes (Signed)
PATIENT CARE CENTER NOTE  Diagnosis: Iron Deficiency Anemia    Provider: Dr. Irene Limbo   Procedure: Christeen Douglas IV    Note: Patient received infusion of Injectafer. Patient tolerated infusion well with no adverse reaction. Monitored patient for 30 minutes post-infusion. Vital signs remained stable. Discharge instructions given. Patient alert, oriented and ambulatory at discharge.

## 2018-08-10 NOTE — Discharge Instructions (Signed)
Ferric carboxymaltose injection What is this medicine? FERRIC CARBOXYMALTOSE (ferr-ik car-box-ee-mol-toes) is an iron complex. Iron is used to make healthy red blood cells, which carry oxygen and nutrients throughout the body. This medicine is used to treat anemia in people with chronic kidney disease or people who cannot take iron by mouth. This medicine may be used for other purposes; ask your health care provider or pharmacist if you have questions. COMMON BRAND NAME(S): Injectafer What should I tell my health care provider before I take this medicine? They need to know if you have any of these conditions: -anemia not caused by low iron levels -high levels of iron in the blood -liver disease -an unusual or allergic reaction to iron, other medicines, foods, dyes, or preservatives -pregnant or trying to get pregnant -breast-feeding How should I use this medicine? This medicine is for infusion into a vein. It is given by a health care professional in a hospital or clinic setting. Talk to your pediatrician regarding the use of this medicine in children. Special care may be needed. Overdosage: If you think you have taken too much of this medicine contact a poison control center or emergency room at once. NOTE: This medicine is only for you. Do not share this medicine with others. What if I miss a dose? It is important not to miss your dose. Call your doctor or health care professional if you are unable to keep an appointment. What may interact with this medicine? Do not take this medicine with any of the following medications: -deferoxamine -dimercaprol -other iron products This medicine may also interact with the following medications: -chloramphenicol -deferasirox This list may not describe all possible interactions. Give your health care provider a list of all the medicines, herbs, non-prescription drugs, or dietary supplements you use. Also tell them if you smoke, drink alcohol, or use  illegal drugs. Some items may interact with your medicine. What should I watch for while using this medicine? Visit your doctor or health care professional regularly. Tell your doctor if your symptoms do not start to get better or if they get worse. You may need blood work done while you are taking this medicine. You may need to follow a special diet. Talk to your doctor. Foods that contain iron include: whole grains/cereals, dried fruits, beans, or peas, leafy green vegetables, and organ meats (liver, kidney). What side effects may I notice from receiving this medicine? Side effects that you should report to your doctor or health care professional as soon as possible: -allergic reactions like skin rash, itching or hives, swelling of the face, lips, or tongue -breathing problems -changes in blood pressure -feeling faint or lightheaded, falls -flushing, sweating, or hot feelings Side effects that usually do not require medical attention (report to your doctor or health care professional if they continue or are bothersome): -changes in taste -constipation -dizziness -headache -nausea -pain, redness, or irritation at site where injected -vomiting This list may not describe all possible side effects. Call your doctor for medical advice about side effects. You may report side effects to FDA at 1-800-FDA-1088. Where should I keep my medicine? This drug is given in a hospital or clinic and will not be stored at home. NOTE: This sheet is a summary. It may not cover all possible information. If you have questions about this medicine, talk to your doctor, pharmacist, or health care provider.  2018 Elsevier/Gold Standard (2015-12-11 11:20:47)  

## 2018-08-11 DIAGNOSIS — Z23 Encounter for immunization: Secondary | ICD-10-CM | POA: Diagnosis not present

## 2018-08-17 ENCOUNTER — Ambulatory Visit (HOSPITAL_COMMUNITY)
Admission: RE | Admit: 2018-08-17 | Discharge: 2018-08-17 | Disposition: A | Discharge status: Home / Self Care | Payer: Medicare Other | Source: Ambulatory Visit | Attending: Hematology | Admitting: Hematology

## 2018-08-17 DIAGNOSIS — D509 Iron deficiency anemia, unspecified: Secondary | ICD-10-CM | POA: Diagnosis not present

## 2018-08-17 MED ORDER — SODIUM CHLORIDE 0.9 % IV SOLN
750.0000 mg | Freq: Once | INTRAVENOUS | Status: AC
  Administered 2018-08-17: 750 mg via INTRAVENOUS
  Filled 2018-08-17: qty 15

## 2018-08-17 MED ORDER — SODIUM CHLORIDE 0.9 % IV SOLN
Freq: Once | INTRAVENOUS | Status: AC
  Administered 2018-08-17: 09:00:00 via INTRAVENOUS

## 2018-08-17 NOTE — Progress Notes (Signed)
Patient received Injectifar via PIV. Observed for at least 30 minutes post infusion. Tolerated well with no adverse reactions, vitals stable, discharge instructions given, verbalized understanding. Patient alert, oriented and ambulatory at the time of discharge.

## 2018-08-17 NOTE — Discharge Instructions (Signed)
Ferric carboxymaltose injection What is this medicine? FERRIC CARBOXYMALTOSE (ferr-ik car-box-ee-mol-toes) is an iron complex. Iron is used to make healthy red blood cells, which carry oxygen and nutrients throughout the body. This medicine is used to treat anemia in people with chronic kidney disease or people who cannot take iron by mouth. This medicine may be used for other purposes; ask your health care provider or pharmacist if you have questions. COMMON BRAND NAME(S): Injectafer What should I tell my health care provider before I take this medicine? They need to know if you have any of these conditions: -anemia not caused by low iron levels -high levels of iron in the blood -liver disease -an unusual or allergic reaction to iron, other medicines, foods, dyes, or preservatives -pregnant or trying to get pregnant -breast-feeding How should I use this medicine? This medicine is for infusion into a vein. It is given by a health care professional in a hospital or clinic setting. Talk to your pediatrician regarding the use of this medicine in children. Special care may be needed. Overdosage: If you think you have taken too much of this medicine contact a poison control center or emergency room at once. NOTE: This medicine is only for you. Do not share this medicine with others. What if I miss a dose? It is important not to miss your dose. Call your doctor or health care professional if you are unable to keep an appointment. What may interact with this medicine? Do not take this medicine with any of the following medications: -deferoxamine -dimercaprol -other iron products This medicine may also interact with the following medications: -chloramphenicol -deferasirox This list may not describe all possible interactions. Give your health care provider a list of all the medicines, herbs, non-prescription drugs, or dietary supplements you use. Also tell them if you smoke, drink alcohol, or use  illegal drugs. Some items may interact with your medicine. What should I watch for while using this medicine? Visit your doctor or health care professional regularly. Tell your doctor if your symptoms do not start to get better or if they get worse. You may need blood work done while you are taking this medicine. You may need to follow a special diet. Talk to your doctor. Foods that contain iron include: whole grains/cereals, dried fruits, beans, or peas, leafy green vegetables, and organ meats (liver, kidney). What side effects may I notice from receiving this medicine? Side effects that you should report to your doctor or health care professional as soon as possible: -allergic reactions like skin rash, itching or hives, swelling of the face, lips, or tongue -breathing problems -changes in blood pressure -feeling faint or lightheaded, falls -flushing, sweating, or hot feelings Side effects that usually do not require medical attention (report to your doctor or health care professional if they continue or are bothersome): -changes in taste -constipation -dizziness -headache -nausea -pain, redness, or irritation at site where injected -vomiting This list may not describe all possible side effects. Call your doctor for medical advice about side effects. You may report side effects to FDA at 1-800-FDA-1088. Where should I keep my medicine? This drug is given in a hospital or clinic and will not be stored at home. NOTE: This sheet is a summary. It may not cover all possible information. If you have questions about this medicine, talk to your doctor, pharmacist, or health care provider.  2018 Elsevier/Gold Standard (2015-12-11 11:20:47)  

## 2018-12-26 DIAGNOSIS — E118 Type 2 diabetes mellitus with unspecified complications: Secondary | ICD-10-CM | POA: Diagnosis not present

## 2018-12-26 DIAGNOSIS — I10 Essential (primary) hypertension: Secondary | ICD-10-CM | POA: Diagnosis not present

## 2018-12-26 DIAGNOSIS — E7800 Pure hypercholesterolemia, unspecified: Secondary | ICD-10-CM | POA: Diagnosis not present

## 2018-12-26 DIAGNOSIS — D509 Iron deficiency anemia, unspecified: Secondary | ICD-10-CM | POA: Diagnosis not present

## 2018-12-28 DIAGNOSIS — H40053 Ocular hypertension, bilateral: Secondary | ICD-10-CM | POA: Diagnosis not present

## 2018-12-28 DIAGNOSIS — H40013 Open angle with borderline findings, low risk, bilateral: Secondary | ICD-10-CM | POA: Diagnosis not present

## 2019-01-02 DIAGNOSIS — E118 Type 2 diabetes mellitus with unspecified complications: Secondary | ICD-10-CM | POA: Diagnosis not present

## 2019-01-02 DIAGNOSIS — I10 Essential (primary) hypertension: Secondary | ICD-10-CM | POA: Diagnosis not present

## 2019-01-02 DIAGNOSIS — E7800 Pure hypercholesterolemia, unspecified: Secondary | ICD-10-CM | POA: Diagnosis not present

## 2019-01-02 DIAGNOSIS — R7989 Other specified abnormal findings of blood chemistry: Secondary | ICD-10-CM | POA: Diagnosis not present

## 2019-01-30 NOTE — Progress Notes (Signed)
HEMATOLOGY/ONCOLOGY CLINIC NOTE  Date of Service: 01/31/19  Patient Care Team: [name], MD as PCP - General (Internal Medicine)   Gastroenterology: Dr. Paulita Fujita  CHIEF COMPLAINTS  Continue mx of Iron deficiency Anemia due to ongoing GI bleeding.  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 Paulita Fujita showed benign polyps. He was recommended repeat colonoscopy in 3-5 years.. .  Current Treatment IV Iron as needed to maintain ferritin >100 B complex 1 cap po daily B12 500 g daily orally  HISTORY OF PRESENTING ILLNESS:  (Plz see initial consultation for details regarding initial presentation)  INTERVAL HISTORY  James Cooke is here for f/u regarding his iron deficiency anemia. The patient's last visit with [place] was on 08/02/18. The pt reports that he is doing well overall.   The pt reports that he has not developed any new concerns in the interim. He denies abdominal pains, blood in the stools and black stools. The pt notes that he has enjoyed good energy levels.  Lab results today (01/31/19) of CBC w/diff and CMP is as follows: all values are WNL except for Lymphs abs at 600, Alk Phos at 129. 01/31/19 Ferritin at 533, Iron saturation at 27%  On review of systems, pt reports good energy levels, eating well, and denies abdominal pains, blood in the stools, black stools, concerns for infections, and any other symptoms.  MEDICAL HISTORY:  Past Medical History:  Diagnosis Date  . Anemia   . Diabetes mellitus without complication ([place])   . Hyperlipidemia   . Hypertension   . Iron deficiency   . Macular degeneration   . Meningitis     SURGICAL HISTORY: Past Surgical History:  Procedure Laterality Date  . APPENDECTOMY    . COLONOSCOPY    . ESOPHAGOGASTRODUODENOSCOPY    . ESOPHAGOGASTRODUODENOSCOPY (EGD) WITH  PROPOFOL N/A 12/10/2015   Procedure: ESOPHAGOGASTRODUODENOSCOPY (EGD) WITH PROPOFOL;  Surgeon: Wilford Corner, MD;  Location: [facility] ENDOSCOPY;  Service: Gastroenterology;  Laterality: N/A;  . GIVENS CAPSULE STUDY N/A 11/10/2015   Procedure: GIVENS CAPSULE STUDY;  Surgeon: Arta Silence, MD;  Location[facility] ENDOSCOPY;  Service: Endoscopy;  Laterality: N/A;  . HERNIA REPAIR    . TONSILLECTOMY      SOCIAL HISTORY: Social History   Socioeconomic History  . Marital status: Single    Spouse name: Not on file  . Number of children: Not on file  . Years of education: Not on file  . Highest education level: Not on file  Occupational History  . Not on file  Social Needs  . Financial resource strain: Not on file  . Food insecurity:    Worry: Not on file    Inability: Not on file  . Transportation needs:    Medical: Not on file    Non-medical: Not on file  Tobacco Use  . Smoking status: Never Smoker  . Smokeless tobacco: Never Used  Substance and Sexual Activity  . Alcohol use: No    Alcohol/week: 0.0 standard drinks  . Drug use: No  . Sexual activity: Never  Lifestyle  . Physical activity:    Days per week: Not on file    Minutes per session: Not on file  . Stress: Not on file  Relationships  . Social connections:    Talks on phone: Not on file    Gets together:  Not on file    Attends religious service: Not on file    Active member of club or organization: Not on file    Attends meetings of clubs or organizations: Not on file    Relationship status: Not on file  . Intimate partner violence:    Fear of current or ex partner: Not on file    Emotionally abused: Not on file    Physically abused: Not on file    Forced sexual activity: Not on file  Other Topics Concern  . Not on file  Social History Narrative  . Not on file    FAMILY HISTORY: Family History  Problem Relation Age of Onset  . CAD Mother     ALLERGIES:  has No Known Allergies.  MEDICATIONS:  Current  Outpatient Medications  Medication Sig Dispense Refill  . ACCU-CHEK AVIVA PLUS test strip     . amLODipine (NORVASC) 5 MG tablet Take 5 mg by mouth every evening.     Marland Kitchen atorvastatin (LIPITOR) 80 MG tablet Take 80 mg by mouth daily at 6 PM.     . b complex vitamins capsule Take 1 capsule by mouth daily.    . B Complex-C-E-Zn (STRESS-600/ZINC PO) Take 1 tablet by mouth daily.    Marland Kitchen bismuth subsalicylate (PEPTO BISMOL) 262 MG/15ML suspension Take 30 mLs by mouth every 6 (six) hours as needed.    Marland Kitchen CALCIUM-MAGNESIUM-VITAMIN D ER PO Take by mouth.    . docusate sodium (COLACE) 100 MG capsule Take 2 capsules (200 mg total) by mouth at bedtime as needed for mild constipation or moderate constipation. 60 capsule 0  . Fish Oil-Cholecalciferol (OMEGA-3 + VITAMIN D3 PO) Take 1 tablet by mouth daily.    Marland Kitchen glimepiride (AMARYL) 2 MG tablet Take 2 mg by mouth daily with breakfast.    . metFORMIN (GLUCOPHAGE) 500 MG tablet Take 500 mg by mouth every evening.     . Multiple Vitamins-Minerals (ICAPS) CAPS Take 1 capsule by mouth daily.    Marland Kitchen omeprazole (PRILOSEC) 20 MG capsule Take 20 mg by mouth daily.     . pioglitazone (ACTOS) 15 MG tablet      No current facility-administered medications for this visit.    Facility-Administered Medications Ordered in Other Visits  Medication Dose Route Frequency Provider Last Rate Last Dose  . 0.9 %  sodium chloride infusion   Intravenous Once [name], MD      . acetaminophen (TYLENOL) tablet 650 mg  650 mg Oral Once [name], MD      . diphenhydrAMINE (BENADRYL) capsule 25 mg  25 mg Oral Once [name], MD        REVIEW OF SYSTEMS:    A 10+ POINT REVIEW OF SYSTEMS WAS OBTAINED including neurology, dermatology, psychiatry, cardiac, respiratory, lymph, extremities, GI, GU, Musculoskeletal, constitutional, breasts, reproductive, HEENT.  All pertinent positives are noted in the HPI.  All others are negative.   PHYSICAL EXAMINATION: ECOG PERFORMANCE STATUS: 2 -  Symptomatic, <50% confined to bed  . Vitals:   07/21/17 1015  Weight: 251 lb 3.2 oz (113.9 kg)  Height: '5\' 10"'  (1.778 m)   Filed Weights   01/31/19 1056  Weight: 254 lb 6.4 oz (115.4 kg)   .Body mass index is 37.57 kg/m.  GENERAL:alert, in no acute distress and comfortable SKIN: no acute rashes, no significant lesions EYES: conjunctiva are pink and non-injected, sclera anicteric OROPHARYNX: MMM, no exudates, no oropharyngeal erythema or ulceration NECK: supple, no JVD LYMPH:  no  palpable lymphadenopathy in the cervical, axillary or inguinal regions LUNGS: clear to auscultation b/l with normal respiratory effort HEART: regular rate & rhythm ABDOMEN:  normoactive bowel sounds , non tender, not distended. No palpable hepatosplenomegaly.  Extremity: no pedal edema PSYCH: alert & oriented x 3 with fluent speech NEURO: no focal motor/sensory deficits   LABORATORY DATA:  I have reviewed the data as listed  . CBC Latest Ref Rng & Units 01/31/2019 08/02/2018 05/02/2018  WBC 4.0 - 10.5 K/uL 7.4 5.6 5.4  Hemoglobin 13.0 - 17.0 g/dL 13.7 13.8 13.9  Hematocrit 39.0 - 52.0 % 42.8 42.8 42.1  Platelets 150 - 400 K/uL 213 214 200   . CBC    Component Value Date/Time   WBC 7.4 01/31/2019 0949   RBC 4.80 01/31/2019 0949   HGB 13.7 01/31/2019 0949   HGB 13.0 01/27/2018 0930   HGB 13.3 10/27/2017 0958   HCT 42.8 01/31/2019 0949   HCT 40.7 10/27/2017 0958   PLT 213 01/31/2019 0949   PLT 260 01/27/2018 0930   PLT 230 10/27/2017 0958   MCV 89.2 01/31/2019 0949   MCV 89.6 10/27/2017 0958   MCH 28.5 01/31/2019 0949   MCHC 32.0 01/31/2019 0949   RDW 13.9 01/31/2019 0949   RDW 14.6 10/27/2017 0958   LYMPHSABS 0.6 (L) 01/31/2019 0949   LYMPHSABS 0.5 (L) 10/27/2017 0958   MONOABS 0.5 01/31/2019 0949   MONOABS 0.3 10/27/2017 0958   EOSABS 0.3 01/31/2019 0949   EOSABS 0.2 10/27/2017 0958   BASOSABS 0.1 01/31/2019 0949   BASOSABS 0.0 10/27/2017 0958   . CMP Latest Ref Rng & Units  01/31/2019 05/02/2018 01/27/2018  Glucose 70 - 99 mg/dL 98 80 95  BUN 8 - 23 mg/dL '14 23 15  ' Creatinine 0.61 - 1.24 mg/dL 0.93 1.15 0.94  Sodium 135 - 145 mmol/L 140 141 142  Potassium 3.5 - 5.1 mmol/L 4.4 4.5 4.4  Chloride 98 - 111 mmol/L 105 106 105  CO2 22 - 32 mmol/L '24 24 27  ' Calcium 8.9 - 10.3 mg/dL 9.0 9.5 9.4  Total Protein 6.5 - 8.1 g/dL 7.2 7.5 7.2  Total Bilirubin 0.3 - 1.2 mg/dL 0.6 0.6 0.6  Alkaline Phos 38 - 126 U/L 129(H) 98 99  AST 15 - 41 U/L '18 18 16  ' ALT 0 - 44 U/L '19 15 17   ' . Lab Results  Component Value Date   IRON 77 01/31/2019   TIBC 288 01/31/2019   IRONPCTSAT 27 01/31/2019   (Iron and TIBC)  Lab Results  Component Value Date   FERRITIN 533 (H) 01/31/2019     RADIOGRAPHIC STUDIES: I have personally reviewed the radiological images as listed and agreed with the findings in the report. No results found.  RADIOGRAPHIC STUDIES: I have personally reviewed the radiological images as listed and agreed with the findings in the report. No results found.  ASSESSMENT & PLAN:   78 y.o. male with   #1 Microcytic hypochromic anemia. Iron deficiency anemia recurrent and likely due to ongoing GI blood losses.    The source of GI bleeding thought to be his Ulcers related to his hiatal hernia . Patient has refused surgical option to address his hiatal hernia with ulceration causing recurrent chronic GI bleeds. Platelet function, PT and PTT were within normal limits and suggest against the presence of a hemostatic defect.  PLAN:  -Discussed pt labwork today, 01/31/19; HGB normal at 13.7, Ferrtin at 533, Iron saturation at 27% -Ferritin at goal which is >  100, no indication for IV Injectafer at this time -No clinical evidence of overt GI bleeding at this time -Continue oral B12 and B complex replacement to support accelerated erythropoiesis. -Continue PPI twice a day pre-meals. -Absolutely avoid NSAIDs and other blood thinners.  -Continued close follow-up with  primary care physician and GI. -Will see the pt back in 4 months   RTC with Dr Irene Limbo with labs in 4 months   All of the patients questions were answered to his apparent satisfaction. The patient knows to call the clinic with any problems, questions or concerns.  The total time spent in the appt was 15 minutes and more than 50% was on counseling and direct patient cares.   Sullivan Lone MD [place] AAHIVMS [facility] Hematology/Oncology Physician [facility]  (Office):       [phone] (Work cell):  [phone] (Fax):           [phone]  I, Baldwin [place], am acting as a scribe for Dr. Sullivan Lone.   .I have reviewed the above documentation for accuracy and completeness, and I agree with the above. Brunetta Genera MD

## 2019-01-31 ENCOUNTER — Other Ambulatory Visit: Payer: Self-pay

## 2019-01-31 ENCOUNTER — Inpatient Hospital Stay: Payer: Medicare Other | Attending: Hematology

## 2019-01-31 ENCOUNTER — Telehealth: Payer: Self-pay | Admitting: Hematology

## 2019-01-31 ENCOUNTER — Inpatient Hospital Stay (HOSPITAL_BASED_OUTPATIENT_CLINIC_OR_DEPARTMENT_OTHER): Payer: Medicare Other | Admitting: Hematology

## 2019-01-31 VITALS — BP 159/82 | HR 85 | Temp 97.8°F | Resp 18 | Ht 69.0 in | Wt 254.4 lb

## 2019-01-31 DIAGNOSIS — E785 Hyperlipidemia, unspecified: Secondary | ICD-10-CM | POA: Diagnosis not present

## 2019-01-31 DIAGNOSIS — Z79899 Other long term (current) drug therapy: Secondary | ICD-10-CM

## 2019-01-31 DIAGNOSIS — Z86010 Personal history of colonic polyps: Secondary | ICD-10-CM | POA: Diagnosis not present

## 2019-01-31 DIAGNOSIS — D500 Iron deficiency anemia secondary to blood loss (chronic): Secondary | ICD-10-CM | POA: Diagnosis not present

## 2019-01-31 DIAGNOSIS — K449 Diaphragmatic hernia without obstruction or gangrene: Secondary | ICD-10-CM | POA: Diagnosis not present

## 2019-01-31 DIAGNOSIS — Z7984 Long term (current) use of oral hypoglycemic drugs: Secondary | ICD-10-CM | POA: Diagnosis not present

## 2019-01-31 DIAGNOSIS — K922 Gastrointestinal hemorrhage, unspecified: Secondary | ICD-10-CM

## 2019-01-31 DIAGNOSIS — I10 Essential (primary) hypertension: Secondary | ICD-10-CM | POA: Insufficient documentation

## 2019-01-31 DIAGNOSIS — K259 Gastric ulcer, unspecified as acute or chronic, without hemorrhage or perforation: Secondary | ICD-10-CM | POA: Insufficient documentation

## 2019-01-31 DIAGNOSIS — E119 Type 2 diabetes mellitus without complications: Secondary | ICD-10-CM

## 2019-01-31 DIAGNOSIS — E538 Deficiency of other specified B group vitamins: Secondary | ICD-10-CM

## 2019-01-31 LAB — CBC WITH DIFFERENTIAL/PLATELET
Abs Immature Granulocytes: 0.02 10*3/uL (ref 0.00–0.07)
BASOS PCT: 1 %
Basophils Absolute: 0.1 10*3/uL (ref 0.0–0.1)
Eosinophils Absolute: 0.3 10*3/uL (ref 0.0–0.5)
Eosinophils Relative: 4 %
HCT: 42.8 % (ref 39.0–52.0)
Hemoglobin: 13.7 g/dL (ref 13.0–17.0)
Immature Granulocytes: 0 %
Lymphocytes Relative: 8 %
Lymphs Abs: 0.6 10*3/uL — ABNORMAL LOW (ref 0.7–4.0)
MCH: 28.5 pg (ref 26.0–34.0)
MCHC: 32 g/dL (ref 30.0–36.0)
MCV: 89.2 fL (ref 80.0–100.0)
Monocytes Absolute: 0.5 10*3/uL (ref 0.1–1.0)
Monocytes Relative: 7 %
NRBC: 0 % (ref 0.0–0.2)
Neutro Abs: 5.9 10*3/uL (ref 1.7–7.7)
Neutrophils Relative %: 80 %
Platelets: 213 10*3/uL (ref 150–400)
RBC: 4.8 MIL/uL (ref 4.22–5.81)
RDW: 13.9 % (ref 11.5–15.5)
WBC: 7.4 10*3/uL (ref 4.0–10.5)

## 2019-01-31 LAB — CMP (CANCER CENTER ONLY)
ALT: 19 U/L (ref 0–44)
AST: 18 U/L (ref 15–41)
Albumin: 3.8 g/dL (ref 3.5–5.0)
Alkaline Phosphatase: 129 U/L — ABNORMAL HIGH (ref 38–126)
Anion gap: 11 (ref 5–15)
BUN: 14 mg/dL (ref 8–23)
CO2: 24 mmol/L (ref 22–32)
Calcium: 9 mg/dL (ref 8.9–10.3)
Chloride: 105 mmol/L (ref 98–111)
Creatinine: 0.93 mg/dL (ref 0.61–1.24)
GFR, Est AFR Am: >60 mL/min (ref >60)
GFR, Estimated: >60 mL/min (ref >60)
Glucose, Bld: 98 mg/dL (ref 70–99)
Potassium: 4.4 mmol/L (ref 3.5–5.1)
Sodium: 140 mmol/L (ref 135–145)
TOTAL PROTEIN: 7.2 g/dL (ref 6.5–8.1)
Total Bilirubin: 0.6 mg/dL (ref 0.3–1.2)

## 2019-01-31 LAB — IRON AND TIBC
IRON: 77 ug/dL (ref 42–163)
SATURATION RATIOS: 27 % (ref 20–55)
TIBC: 288 ug/dL (ref 202–409)
UIBC: 210 ug/dL (ref 117–376)

## 2019-01-31 LAB — FERRITIN: Ferritin: 533 ng/mL — ABNORMAL HIGH (ref 24–336)

## 2019-01-31 LAB — VITAMIN B12: VITAMIN B 12: 587 pg/mL (ref 180–914)

## 2019-01-31 NOTE — Telephone Encounter (Signed)
Scheduled appt per 3/11 los.  Patient aware of appt date and time.

## 2019-04-11 ENCOUNTER — Other Ambulatory Visit: Payer: Self-pay

## 2019-04-11 NOTE — Patient Outreach (Signed)
[facility]) Care Management  04/11/2019  Kristi Hyer [birth year] [id number]   Medication Adherence call to Mr. Davaun Quintela patient did not answer patient is due on Atorvastatin 80 mg under [facility].   [facility] Management Direct Dial [phone]  Fax [phone] [email or web address]

## 2019-05-29 NOTE — Progress Notes (Signed)
HEMATOLOGY/ONCOLOGY CLINIC NOTE  Date of Service: 05/30/19  Patient Care Team: [name], MD as PCP - General (Internal Medicine)   Gastroenterology: Dr. Paulita Fujita  CHIEF COMPLAINTS  Continue mx of Iron deficiency Anemia due to ongoing GI bleeding.  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 Paulita Fujita showed benign polyps. He was recommended repeat colonoscopy in 3-5 years.. .  Current Treatment IV Iron as needed to maintain ferritin >100 B complex 1 cap po daily B12 500 g daily orally  HISTORY OF PRESENTING ILLNESS:  (Plz see initial consultation for details regarding initial presentation)  INTERVAL HISTORY  James Cooke is here for f/u regarding his iron deficiency anemia. The patient's last visit with [place] was on 01/31/19. The pt reports that he is doing well overall.  The pt reports that he has not developed any new concerns in the interim. He denies noticing any blood in the stools nor black stools. He has been eating healthy and notes that he has had good energy levels. He notes that he has been having smooth, regular bowel movements.  Lab results today (05/30/19) of CBC w/diff and CMP is as follows: all values are WNL except for Calcium at 8.6. 05/30/19 Ferritin at 422  On review of systems, pt reports good energy levels, eating well, moving his bowels well, and denies blood in the stools, black stools, abdominal pains, leg swelling, and any other symptoms.  MEDICAL HISTORY:  Past Medical History:  Diagnosis Date  . Anemia   . Diabetes mellitus without complication ([place])   . Hyperlipidemia   . Hypertension   . Iron deficiency   . Macular degeneration   . Meningitis     SURGICAL HISTORY: Past Surgical History:  Procedure Laterality Date  . APPENDECTOMY    . COLONOSCOPY    . ESOPHAGOGASTRODUODENOSCOPY     . ESOPHAGOGASTRODUODENOSCOPY (EGD) WITH PROPOFOL N/A 12/10/2015   Procedure: ESOPHAGOGASTRODUODENOSCOPY (EGD) WITH PROPOFOL;  Surgeon: Wilford Corner, MD;  Location: [facility] ENDOSCOPY;  Service: Gastroenterology;  Laterality: N/A;  . GIVENS CAPSULE STUDY N/A 11/10/2015   Procedure: GIVENS CAPSULE STUDY;  Surgeon: Arta Silence, MD;  Location: [facility] ENDOSCOPY;  Service: Endoscopy;  Laterality: N/A;  . HERNIA REPAIR    . TONSILLECTOMY      SOCIAL HISTORY: Social History   Socioeconomic History  . Marital status: Single    Spouse name: Not on file  . Number of children: Not on file  . Years of education: Not on file  . Highest education level: Not on file  Occupational History  . Not on file  Social Needs  . Financial resource strain: Not on file  . Food insecurity    Worry: Not on file    Inability: Not on file  . Transportation needs    Medical: Not on file    Non-medical: Not on file  Tobacco Use  . Smoking status: Never Smoker  . Smokeless tobacco: Never Used  Substance and Sexual Activity  . Alcohol use: No    Alcohol/week: 0.0 standard drinks  . Drug use: No  . Sexual activity: Never  Lifestyle  . Physical activity    Days per week: Not on file    Minutes per session: Not on file  . Stress: Not on file  Relationships  . Social Herbalist on phone:  Not on file    Gets together: Not on file    Attends religious service: Not on file    Active member of club or organization: Not on file    Attends meetings of clubs or organizations: Not on file    Relationship status: Not on file  . Intimate partner violence    Fear of current or ex partner: Not on file    Emotionally abused: Not on file    Physically abused: Not on file    Forced sexual activity: Not on file  Other Topics Concern  . Not on file  Social History Narrative  . Not on file    FAMILY HISTORY: Family History  Problem Relation Age of Onset  . CAD Mother     ALLERGIES:  has No Known  Allergies.  MEDICATIONS:  Current Outpatient Medications  Medication Sig Dispense Refill  . ACCU-CHEK AVIVA PLUS test strip     . amLODipine (NORVASC) 5 MG tablet Take 5 mg by mouth every evening.     Marland Kitchen atorvastatin (LIPITOR) 80 MG tablet Take 80 mg by mouth daily at 6 PM.     . B Complex-C-E-Zn (STRESS-600/ZINC PO) Take 1 tablet by mouth daily.    Marland Kitchen CALCIUM-MAGNESIUM-VITAMIN D ER PO Take by mouth.    . docusate sodium (COLACE) 100 MG capsule Take 2 capsules (200 mg total) by mouth at bedtime as needed for mild constipation or moderate constipation. 60 capsule 0  . glimepiride (AMARYL) 2 MG tablet Take 2 mg by mouth daily with breakfast.    . metFORMIN (GLUCOPHAGE) 500 MG tablet Take 500 mg by mouth every evening.     . Multiple Vitamins-Minerals (ICAPS) CAPS Take 1 capsule by mouth daily.    Marland Kitchen omeprazole (PRILOSEC) 20 MG capsule Take 20 mg by mouth daily.     . pioglitazone (ACTOS) 15 MG tablet      No current facility-administered medications for this visit.    Facility-Administered Medications Ordered in Other Visits  Medication Dose Route Frequency Provider Last Rate Last Dose  . 0.9 %  sodium chloride infusion   Intravenous Once [name], MD      . acetaminophen (TYLENOL) tablet 650 mg  650 mg Oral Once [name], MD      . diphenhydrAMINE (BENADRYL) capsule 25 mg  25 mg Oral Once [name], MD        REVIEW OF SYSTEMS:    A 10+ POINT REVIEW OF SYSTEMS WAS OBTAINED including neurology, dermatology, psychiatry, cardiac, respiratory, lymph, extremities, GI, GU, Musculoskeletal, constitutional, breasts, reproductive, HEENT.  All pertinent positives are noted in the HPI.  All others are negative.   PHYSICAL EXAMINATION: ECOG PERFORMANCE STATUS: 2 - Symptomatic, <50% confined to bed  . Vitals:   07/21/17 1015  Weight: 251 lb 3.2 oz (113.9 kg)  Height: 5\' 10"  (1.778 m)   Filed Weights   05/30/19 1021  Weight: 249 lb 9.6 oz (113.2 kg)   .Body mass index is 36.86 kg/m.   GENERAL:alert, in no acute distress and comfortable SKIN: no acute rashes, no significant lesions EYES: conjunctiva are pink and non-injected, sclera anicteric OROPHARYNX: MMM, no exudates, no oropharyngeal erythema or ulceration NECK: supple, no JVD LYMPH:  no palpable lymphadenopathy in the cervical, axillary or inguinal regions LUNGS: clear to auscultation b/l with normal respiratory effort HEART: regular rate & rhythm ABDOMEN:  normoactive bowel sounds , non tender, not distended. No palpable hepatosplenomegaly.  Extremity: no pedal edema PSYCH: alert & oriented  x 3 with fluent speech NEURO: no focal motor/sensory deficits   LABORATORY DATA:  I have reviewed the data as listed  . CBC Latest Ref Rng & Units 05/30/2019 01/31/2019 08/02/2018  WBC 4.0 - 10.5 K/uL 6.6 7.4 5.6  Hemoglobin 13.0 - 17.0 g/dL 14.2 13.7 13.8  Hematocrit 39.0 - 52.0 % 44.2 42.8 42.8  Platelets 150 - 400 K/uL 218 213 214   . CBC    Component Value Date/Time   WBC 6.6 05/30/2019 0948   RBC 4.98 05/30/2019 0948   HGB 14.2 05/30/2019 0948   HGB 13.0 01/27/2018 0930   HGB 13.3 10/27/2017 0958   HCT 44.2 05/30/2019 0948   HCT 40.7 10/27/2017 0958   PLT 218 05/30/2019 0948   PLT 260 01/27/2018 0930   PLT 230 10/27/2017 0958   MCV 88.8 05/30/2019 0948   MCV 89.6 10/27/2017 0958   MCH 28.5 05/30/2019 0948   MCHC 32.1 05/30/2019 0948   RDW 14.5 05/30/2019 0948   RDW 14.6 10/27/2017 0958   LYMPHSABS 0.7 05/30/2019 0948   LYMPHSABS 0.5 (L) 10/27/2017 0958   MONOABS 0.5 05/30/2019 0948   MONOABS 0.3 10/27/2017 0958   EOSABS 0.3 05/30/2019 0948   EOSABS 0.2 10/27/2017 0958   BASOSABS 0.1 05/30/2019 0948   BASOSABS 0.0 10/27/2017 0958   . CMP Latest Ref Rng & Units 05/30/2019 01/31/2019 05/02/2018  Glucose 70 - 99 mg/dL 97 98 80  BUN 8 - 23 mg/dL 14 14 23   Creatinine 0.61 - 1.24 mg/dL 0.94 0.93 1.15  Sodium 135 - 145 mmol/L 140 140 141  Potassium 3.5 - 5.1 mmol/L 4.2 4.4 4.5  Chloride 98 - 111 mmol/L 105  105 106  CO2 22 - 32 mmol/L 25 24 24   Calcium 8.9 - 10.3 mg/dL 8.6(L) 9.0 9.5  Total Protein 6.5 - 8.1 g/dL 7.4 7.2 7.5  Total Bilirubin 0.3 - 1.2 mg/dL 0.5 0.6 0.6  Alkaline Phos 38 - 126 U/L 109 129(H) 98  AST 15 - 41 U/L 21 18 18   ALT 0 - 44 U/L 23 19 15    . Lab Results  Component Value Date   IRON 73 05/30/2019   TIBC 304 05/30/2019   IRONPCTSAT 24 05/30/2019   (Iron and TIBC)  Lab Results  Component Value Date   FERRITIN 422 (H) 05/30/2019     RADIOGRAPHIC STUDIES: I have personally reviewed the radiological images as listed and agreed with the findings in the report. No results found.  RADIOGRAPHIC STUDIES: I have personally reviewed the radiological images as listed and agreed with the findings in the report. No results found.  ASSESSMENT & PLAN:   78 y.o. male with   #1 Microcytic hypochromic anemia. Iron deficiency anemia recurrent and likely due to ongoing GI blood losses.    The source of GI bleeding thought to be his Ulcers related to his hiatal hernia . Patient has refused surgical option to address his hiatal hernia with ulceration causing recurrent chronic GI bleeds. Platelet function, PT and PTT were within normal limits and suggest against the presence of a hemostatic defect.  PLAN: -Discussed pt labwork today, 05/30/19; blood counts normal including HGB at 14.2 and MCV of 88.8. Ferritin at 422 and 24% saturation.  -No current indication for IV Iron  -Pt's ferritin has held well in the last 3 months and pt denies noticing any blood in the stools nor black stools -Ferritin at goal which is >100 -Continue oral B12 and B complex replacement to support accelerated  erythropoiesis. -Continue PPI twice a day pre-meals. -Absolutely avoid NSAIDs and other blood thinners.  -Continued close follow-up with primary care physician and GI. -Will see the pt back in 4 months   RTC with Dr Irene Limbo with labs in 4 months   All of the patients questions were answered  to his apparent satisfaction. The patient knows to call the clinic with any problems, questions or concerns.  The total time spent in the appt was 20 minutes and more than 50% was on counseling and direct patient cares.   Sullivan Lone MD [place] AAHIVMS [facility] Hematology/Oncology Physician [facility]  (Office):       [phone] (Work cell):  [phone] (Fax):           [phone]  I, Baldwin [place], am acting as a scribe for Dr. Sullivan Lone.   .I have reviewed the above documentation for accuracy and completeness, and I agree with the above. Brunetta Genera MD

## 2019-05-30 ENCOUNTER — Other Ambulatory Visit: Payer: Self-pay

## 2019-05-30 ENCOUNTER — Inpatient Hospital Stay: Payer: Medicare Other | Admitting: Hematology

## 2019-05-30 ENCOUNTER — Telehealth: Payer: Self-pay | Admitting: Hematology

## 2019-05-30 ENCOUNTER — Inpatient Hospital Stay: Payer: Medicare Other | Attending: Hematology

## 2019-05-30 VITALS — BP 174/83 | HR 96 | Temp 98.9°F | Resp 18 | Ht 69.0 in | Wt 249.6 lb

## 2019-05-30 DIAGNOSIS — D509 Iron deficiency anemia, unspecified: Secondary | ICD-10-CM

## 2019-05-30 DIAGNOSIS — Z7984 Long term (current) use of oral hypoglycemic drugs: Secondary | ICD-10-CM | POA: Diagnosis not present

## 2019-05-30 DIAGNOSIS — K449 Diaphragmatic hernia without obstruction or gangrene: Secondary | ICD-10-CM | POA: Diagnosis not present

## 2019-05-30 DIAGNOSIS — I10 Essential (primary) hypertension: Secondary | ICD-10-CM | POA: Insufficient documentation

## 2019-05-30 DIAGNOSIS — Z79899 Other long term (current) drug therapy: Secondary | ICD-10-CM | POA: Diagnosis not present

## 2019-05-30 DIAGNOSIS — E119 Type 2 diabetes mellitus without complications: Secondary | ICD-10-CM | POA: Insufficient documentation

## 2019-05-30 DIAGNOSIS — E785 Hyperlipidemia, unspecified: Secondary | ICD-10-CM | POA: Diagnosis not present

## 2019-05-30 DIAGNOSIS — D500 Iron deficiency anemia secondary to blood loss (chronic): Secondary | ICD-10-CM

## 2019-05-30 LAB — CBC WITH DIFFERENTIAL/PLATELET
Abs Immature Granulocytes: 0.03 10*3/uL (ref 0.00–0.07)
Basophils Absolute: 0.1 10*3/uL (ref 0.0–0.1)
Basophils Relative: 1 %
Eosinophils Absolute: 0.3 10*3/uL (ref 0.0–0.5)
Eosinophils Relative: 4 %
HCT: 44.2 % (ref 39.0–52.0)
Hemoglobin: 14.2 g/dL (ref 13.0–17.0)
Immature Granulocytes: 1 %
Lymphocytes Relative: 10 %
Lymphs Abs: 0.7 10*3/uL (ref 0.7–4.0)
MCH: 28.5 pg (ref 26.0–34.0)
MCHC: 32.1 g/dL (ref 30.0–36.0)
MCV: 88.8 fL (ref 80.0–100.0)
Monocytes Absolute: 0.5 10*3/uL (ref 0.1–1.0)
Monocytes Relative: 8 %
Neutro Abs: 5 10*3/uL (ref 1.7–7.7)
Neutrophils Relative %: 76 %
Platelets: 218 10*3/uL (ref 150–400)
RBC: 4.98 MIL/uL (ref 4.22–5.81)
RDW: 14.5 % (ref 11.5–15.5)
WBC: 6.6 10*3/uL (ref 4.0–10.5)
nRBC: 0 % (ref 0.0–0.2)

## 2019-05-30 LAB — CMP (CANCER CENTER ONLY)
ALT: 23 U/L (ref 0–44)
AST: 21 U/L (ref 15–41)
Albumin: 3.9 g/dL (ref 3.5–5.0)
Alkaline Phosphatase: 109 U/L (ref 38–126)
Anion gap: 10 (ref 5–15)
BUN: 14 mg/dL (ref 8–23)
CO2: 25 mmol/L (ref 22–32)
Calcium: 8.6 mg/dL — ABNORMAL LOW (ref 8.9–10.3)
Chloride: 105 mmol/L (ref 98–111)
Creatinine: 0.94 mg/dL (ref 0.61–1.24)
GFR, Est AFR Am: >60 mL/min (ref >60)
GFR, Estimated: >60 mL/min (ref >60)
Glucose, Bld: 97 mg/dL (ref 70–99)
Potassium: 4.2 mmol/L (ref 3.5–5.1)
Sodium: 140 mmol/L (ref 135–145)
Total Bilirubin: 0.5 mg/dL (ref 0.3–1.2)
Total Protein: 7.4 g/dL (ref 6.5–8.1)

## 2019-05-30 LAB — IRON AND TIBC
Iron: 73 ug/dL (ref 42–163)
Saturation Ratios: 24 % (ref 20–55)
TIBC: 304 ug/dL (ref 202–409)
UIBC: 231 ug/dL (ref 117–376)

## 2019-05-30 LAB — FERRITIN: Ferritin: 422 ng/mL — ABNORMAL HIGH (ref 24–336)

## 2019-05-30 NOTE — Telephone Encounter (Signed)
Scheduled appt per 7/8 los.  Spoke with patient and he is aware of his appt date and time.

## 2019-07-05 DIAGNOSIS — H353132 Nonexudative age-related macular degeneration, bilateral, intermediate dry stage: Secondary | ICD-10-CM | POA: Diagnosis not present

## 2019-07-05 DIAGNOSIS — H40013 Open angle with borderline findings, low risk, bilateral: Secondary | ICD-10-CM | POA: Diagnosis not present

## 2019-07-05 DIAGNOSIS — Z961 Presence of intraocular lens: Secondary | ICD-10-CM | POA: Diagnosis not present

## 2019-07-05 DIAGNOSIS — E119 Type 2 diabetes mellitus without complications: Secondary | ICD-10-CM | POA: Diagnosis not present

## 2019-07-09 DIAGNOSIS — E119 Type 2 diabetes mellitus without complications: Secondary | ICD-10-CM | POA: Diagnosis not present

## 2019-07-09 DIAGNOSIS — E7800 Pure hypercholesterolemia, unspecified: Secondary | ICD-10-CM | POA: Diagnosis not present

## 2019-07-09 DIAGNOSIS — D509 Iron deficiency anemia, unspecified: Secondary | ICD-10-CM | POA: Diagnosis not present

## 2019-07-09 DIAGNOSIS — Z0000 Encounter for general adult medical examination without abnormal findings: Secondary | ICD-10-CM | POA: Diagnosis not present

## 2019-07-16 ENCOUNTER — Other Ambulatory Visit: Payer: Self-pay

## 2019-07-16 DIAGNOSIS — E7800 Pure hypercholesterolemia, unspecified: Secondary | ICD-10-CM | POA: Diagnosis not present

## 2019-07-16 DIAGNOSIS — I10 Essential (primary) hypertension: Secondary | ICD-10-CM | POA: Diagnosis not present

## 2019-07-16 DIAGNOSIS — E119 Type 2 diabetes mellitus without complications: Secondary | ICD-10-CM | POA: Diagnosis not present

## 2019-07-16 DIAGNOSIS — Z0000 Encounter for general adult medical examination without abnormal findings: Secondary | ICD-10-CM | POA: Diagnosis not present

## 2019-07-16 DIAGNOSIS — D509 Iron deficiency anemia, unspecified: Secondary | ICD-10-CM | POA: Diagnosis not present

## 2019-07-16 NOTE — Patient Outreach (Signed)
[place] [facility]) Care Management  07/16/2019  James Cooke [birth year] [id number]  Medication Adherence call to James Cooke [place] Identifiers Verify spoke with patient he is past due on Atorvastatin 80 mg patient explain he takes 1 tablet daily patient has medication at this time patient will order when due.James Cooke is showing past due under [place].   [place] Management Direct Dial [phone]  Fax [phone] [email or web address]

## 2019-08-02 DIAGNOSIS — Z23 Encounter for immunization: Secondary | ICD-10-CM | POA: Diagnosis not present

## 2019-10-02 NOTE — Progress Notes (Signed)
HEMATOLOGY/ONCOLOGY CLINIC NOTE  Date of Service: 10/03/19  Patient Care Team: [name], MD as PCP - General (Internal Medicine)   Gastroenterology: Dr. Paulita Fujita  CHIEF COMPLAINTS  Continue mx of Iron deficiency Anemia due to ongoing GI bleeding.  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 Paulita Fujita showed benign polyps. He was recommended repeat colonoscopy in 3-5 years.  Current Treatment IV Iron as needed to maintain ferritin >100 B complex 1 cap po daily B12 500 g daily orally  HISTORY OF PRESENTING ILLNESS:  (Plz see initial consultation for details regarding initial presentation)  INTERVAL HISTORY  Mr Bechard is here for f/u regarding his iron deficiency anemia. The patient's last visit with [place] was on 05/30/2019. The pt reports that he is doing well overall.  The pt reports that he has been feeling better and has continued to stay in the house for safety reasons. Pt saw Dr. Maudie Mercury recently who also felt that the pt was doing well.  Lab results today (10/03/19) of CBC w/diff  is as follows: all values are WNL.  10/03/2019 Ferritin at 410  On review of systems, pt denies black/bloody stools, abdominal pain and any other symptoms.   MEDICAL HISTORY:  Past Medical History:  Diagnosis Date  . Anemia   . Diabetes mellitus without complication ([place])   . Hyperlipidemia   . Hypertension   . Iron deficiency   . Macular degeneration   . Meningitis     SURGICAL HISTORY: Past Surgical History:  Procedure Laterality Date  . APPENDECTOMY    . COLONOSCOPY    . ESOPHAGOGASTRODUODENOSCOPY    . ESOPHAGOGASTRODUODENOSCOPY (EGD) WITH PROPOFOL N/A 12/10/2015   Procedure: ESOPHAGOGASTRODUODENOSCOPY (EGD) WITH PROPOFOL;  Surgeon: Wilford Corner, MD;  Location: [facility] ENDOSCOPY;  Service: Gastroenterology;  Laterality:  N/A;  . GIVENS CAPSULE STUDY N/A 11/10/2015   Procedure: GIVENS CAPSULE STUDY;  Surgeon: Arta Silence, MD;  Location: [facility] ENDOSCOPY;  Service: Endoscopy;  Laterality: N/A;  . HERNIA REPAIR    . TONSILLECTOMY      SOCIAL HISTORY: Social History   Socioeconomic History  . Marital status: Single    Spouse name: Not on file  . Number of children: Not on file  . Years of education: Not on file  . Highest education level: Not on file  Occupational History  . Not on file  Social Needs  . Financial resource strain: Not on file  . Food insecurity    Worry: Not on file    Inability: Not on file  . Transportation needs    Medical: Not on file    Non-medical: Not on file  Tobacco Use  . Smoking status: Never Smoker  . Smokeless tobacco: Never Used  Substance and Sexual Activity  . Alcohol use: No    Alcohol/week: 0.0 standard drinks  . Drug use: No  . Sexual activity: Never  Lifestyle  . Physical activity    Days per week: Not on file    Minutes per session: Not on file  . Stress: Not on file  Relationships  . Social Herbalist on phone: Not on file    Gets together: Not on file    Attends religious service: Not on file    Active member of club or organization: Not on file    Attends meetings of  clubs or organizations: Not on file    Relationship status: Not on file  . Intimate partner violence    Fear of current or ex partner: Not on file    Emotionally abused: Not on file    Physically abused: Not on file    Forced sexual activity: Not on file  Other Topics Concern  . Not on file  Social History Narrative  . Not on file    FAMILY HISTORY: Family History  Problem Relation Age of Onset  . CAD Mother     ALLERGIES:  has No Known Allergies.  MEDICATIONS:  Current Outpatient Medications  Medication Sig Dispense Refill  . ACCU-CHEK AVIVA PLUS test strip     . amLODipine (NORVASC) 5 MG tablet Take 5 mg by mouth every evening.     Marland Kitchen atorvastatin  (LIPITOR) 80 MG tablet Take 80 mg by mouth daily at 6 PM.     . B Complex-C-E-Zn (STRESS-600/ZINC PO) Take 1 tablet by mouth daily.    Marland Kitchen CALCIUM-MAGNESIUM-VITAMIN D ER PO Take by mouth.    . docusate sodium (COLACE) 100 MG capsule Take 2 capsules (200 mg total) by mouth at bedtime as needed for mild constipation or moderate constipation. 60 capsule 0  . glimepiride (AMARYL) 2 MG tablet Take 2 mg by mouth daily with breakfast.    . metFORMIN (GLUCOPHAGE) 500 MG tablet Take 500 mg by mouth every evening.     . Multiple Vitamins-Minerals (ICAPS) CAPS Take 1 capsule by mouth daily.    Marland Kitchen omeprazole (PRILOSEC) 20 MG capsule Take 20 mg by mouth daily.     . pioglitazone (ACTOS) 15 MG tablet      No current facility-administered medications for this visit.    Facility-Administered Medications Ordered in Other Visits  Medication Dose Route Frequency Provider Last Rate Last Dose  . 0.9 %  sodium chloride infusion   Intravenous Once [name], MD      . acetaminophen (TYLENOL) tablet 650 mg  650 mg Oral Once [name], MD      . diphenhydrAMINE (BENADRYL) capsule 25 mg  25 mg Oral Once [name], MD        REVIEW OF SYSTEMS:   A 10+ POINT REVIEW OF SYSTEMS WAS OBTAINED including neurology, dermatology, psychiatry, cardiac, respiratory, lymph, extremities, GI, GU, Musculoskeletal, constitutional, breasts, reproductive, HEENT.  All pertinent positives are noted in the HPI.  All others are negative.   PHYSICAL EXAMINATION: ECOG PERFORMANCE STATUS: 2 - Symptomatic, <50% confined to bed   Vitals:   07/21/17 1015  Weight: 251 lb 3.2 oz (113.9 kg)  Height: 5\' 10"  (1.778 m)    Exam was given in a chair   GENERAL:alert, in no acute distress and comfortable SKIN: no acute rashes, no significant lesions EYES: conjunctiva are pink and non-injected, sclera anicteric OROPHARYNX: MMM, no exudates, no oropharyngeal erythema or ulceration NECK: supple, no JVD LYMPH:  no palpable lymphadenopathy in the  cervical, axillary or inguinal regions LUNGS: clear to auscultation b/l with normal respiratory effort HEART: regular rate & rhythm ABDOMEN:  normoactive bowel sounds , non tender, not distended. No palpable hepatosplenomegaly.  Extremity: no pedal edema PSYCH: alert & oriented x 3 with fluent speech NEURO: no focal motor/sensory deficits  LABORATORY DATA:  I have reviewed the data as listed  . CBC Latest Ref Rng & Units 10/03/2019 05/30/2019 01/31/2019  WBC 4.0 - 10.5 K/uL 6.5 6.6 7.4  Hemoglobin 13.0 - 17.0 g/dL 14.1 14.2 13.7  Hematocrit 39.0 -  52.0 % 43.9 44.2 42.8  Platelets 150 - 400 K/uL 202 218 213   . CBC    Component Value Date/Time   WBC 6.5 10/03/2019 1005   RBC 5.02 10/03/2019 1005   HGB 14.1 10/03/2019 1005   HGB 13.0 01/27/2018 0930   HGB 13.3 10/27/2017 0958   HCT 43.9 10/03/2019 1005   HCT 40.7 10/27/2017 0958   PLT 202 10/03/2019 1005   PLT 260 01/27/2018 0930   PLT 230 10/27/2017 0958   MCV 87.5 10/03/2019 1005   MCV 89.6 10/27/2017 0958   MCH 28.1 10/03/2019 1005   MCHC 32.1 10/03/2019 1005   RDW 13.9 10/03/2019 1005   RDW 14.6 10/27/2017 0958   LYMPHSABS 0.8 10/03/2019 1005   LYMPHSABS 0.5 (L) 10/27/2017 0958   MONOABS 0.6 10/03/2019 1005   MONOABS 0.3 10/27/2017 0958   EOSABS 0.3 10/03/2019 1005   EOSABS 0.2 10/27/2017 0958   BASOSABS 0.1 10/03/2019 1005   BASOSABS 0.0 10/27/2017 0958   . CMP Latest Ref Rng & Units 05/30/2019 01/31/2019 05/02/2018  Glucose 70 - 99 mg/dL 97 98 80  BUN 8 - 23 mg/dL 14 14 23   Creatinine 0.61 - 1.24 mg/dL 0.94 0.93 1.15  Sodium 135 - 145 mmol/L 140 140 141  Potassium 3.5 - 5.1 mmol/L 4.2 4.4 4.5  Chloride 98 - 111 mmol/L 105 105 106  CO2 22 - 32 mmol/L 25 24 24   Calcium 8.9 - 10.3 mg/dL 8.6(L) 9.0 9.5  Total Protein 6.5 - 8.1 g/dL 7.4 7.2 7.5  Total Bilirubin 0.3 - 1.2 mg/dL 0.5 0.6 0.6  Alkaline Phos 38 - 126 U/L 109 129(H) 98  AST 15 - 41 U/L 21 18 18   ALT 0 - 44 U/L 23 19 15    . Lab Results  Component  Value Date   IRON 73 05/30/2019   TIBC 304 05/30/2019   IRONPCTSAT 24 05/30/2019   (Iron and TIBC)  Lab Results  Component Value Date   FERRITIN 410 (H) 10/03/2019     RADIOGRAPHIC STUDIES: I have personally reviewed the radiological images as listed and agreed with the findings in the report. No results found.  RADIOGRAPHIC STUDIES: I have personally reviewed the radiological images as listed and agreed with the findings in the report. No results found.  ASSESSMENT & PLAN:   78 y.o. male with   #1 Microcytic hypochromic anemia. Iron deficiency anemia recurrent and likely due to ongoing GI blood losses.    The source of GI bleeding thought to be his Ulcers related to his hiatal hernia . Patient has refused surgical option to address his hiatal hernia with ulceration causing recurrent chronic GI bleeds. Platelet function, PT and PTT were within normal limits and suggest against the presence of a hemostatic defect.  PLAN: -Discussed pt labwork today, 10/03/19; all values are WNL.  -Discussed 10/03/2019 Ferritin at 410 -Ferritin at goal which is >100 -No current indication for IV Iron  -Pt's ferritin has held well in the last 8 months and pt denies noticing any blood in the stools nor black stools -Pt does not appear to still be bleeding from his GI tract based lab/clinical evidence -Continue oral B12 and B complex replacement to support accelerated erythropoiesis. -Continue PPI twice a day pre-meals. -Absolutely avoid NSAIDs and other blood thinners.  -Continued close follow-up with primary care physician and GI. -Will see back in 6 months with labs  -Advised pt to look for fatigue, blood/black stools and to contact with any concerns  FOLLOW UP: RTC with Dr Irene Limbo with labs in 6 months   The total time spent in the appt was 15 minutes and more than 50% was on counseling and direct patient cares.  All of the patient's questions were answered with apparent satisfaction.  The patient knows to call the clinic with any problems, questions or concerns.  Sullivan Lone MD [place] AAHIVMS [facility] Hematology/Oncology Physician [facility]  (Office):       [phone] (Work cell):  [phone] (Fax):           [phone]  I, Yevette Edwards, am acting as a scribe for Dr. Sullivan Lone.   .I have reviewed the above documentation for accuracy and completeness, and I agree with the above. Brunetta Genera MD

## 2019-10-03 ENCOUNTER — Inpatient Hospital Stay: Payer: Medicare Other | Attending: Hematology

## 2019-10-03 ENCOUNTER — Other Ambulatory Visit: Payer: Self-pay

## 2019-10-03 ENCOUNTER — Inpatient Hospital Stay: Payer: Medicare Other | Admitting: Hematology

## 2019-10-03 VITALS — BP 156/87 | HR 94 | Temp 98.0°F | Resp 18 | Ht 69.0 in | Wt 258.0 lb

## 2019-10-03 DIAGNOSIS — K922 Gastrointestinal hemorrhage, unspecified: Secondary | ICD-10-CM | POA: Diagnosis not present

## 2019-10-03 DIAGNOSIS — D509 Iron deficiency anemia, unspecified: Secondary | ICD-10-CM | POA: Insufficient documentation

## 2019-10-03 DIAGNOSIS — E119 Type 2 diabetes mellitus without complications: Secondary | ICD-10-CM | POA: Insufficient documentation

## 2019-10-03 DIAGNOSIS — D500 Iron deficiency anemia secondary to blood loss (chronic): Secondary | ICD-10-CM

## 2019-10-03 DIAGNOSIS — Z79899 Other long term (current) drug therapy: Secondary | ICD-10-CM | POA: Insufficient documentation

## 2019-10-03 DIAGNOSIS — Z7984 Long term (current) use of oral hypoglycemic drugs: Secondary | ICD-10-CM | POA: Insufficient documentation

## 2019-10-03 DIAGNOSIS — E785 Hyperlipidemia, unspecified: Secondary | ICD-10-CM | POA: Diagnosis not present

## 2019-10-03 DIAGNOSIS — I10 Essential (primary) hypertension: Secondary | ICD-10-CM | POA: Insufficient documentation

## 2019-10-03 LAB — CBC WITH DIFFERENTIAL/PLATELET
Abs Immature Granulocytes: 0.03 10*3/uL (ref 0.00–0.07)
Basophils Absolute: 0.1 10*3/uL (ref 0.0–0.1)
Basophils Relative: 1 %
Eosinophils Absolute: 0.3 10*3/uL (ref 0.0–0.5)
Eosinophils Relative: 5 %
HCT: 43.9 % (ref 39.0–52.0)
Hemoglobin: 14.1 g/dL (ref 13.0–17.0)
Immature Granulocytes: 1 %
Lymphocytes Relative: 12 %
Lymphs Abs: 0.8 10*3/uL (ref 0.7–4.0)
MCH: 28.1 pg (ref 26.0–34.0)
MCHC: 32.1 g/dL (ref 30.0–36.0)
MCV: 87.5 fL (ref 80.0–100.0)
Monocytes Absolute: 0.6 10*3/uL (ref 0.1–1.0)
Monocytes Relative: 10 %
Neutro Abs: 4.7 10*3/uL (ref 1.7–7.7)
Neutrophils Relative %: 71 %
Platelets: 202 10*3/uL (ref 150–400)
RBC: 5.02 MIL/uL (ref 4.22–5.81)
RDW: 13.9 % (ref 11.5–15.5)
WBC: 6.5 10*3/uL (ref 4.0–10.5)
nRBC: 0 % (ref 0.0–0.2)

## 2019-10-03 LAB — FERRITIN: Ferritin: 410 ng/mL — ABNORMAL HIGH (ref 24–336)

## 2019-10-04 ENCOUNTER — Telehealth: Payer: Self-pay | Admitting: Hematology

## 2019-10-04 NOTE — Telephone Encounter (Signed)
Scheduled appt per 11/11 los.  Spoke with pt and he is aware of his appt date and time,

## 2020-01-08 DIAGNOSIS — H40013 Open angle with borderline findings, low risk, bilateral: Secondary | ICD-10-CM | POA: Diagnosis not present

## 2020-01-09 DIAGNOSIS — I10 Essential (primary) hypertension: Secondary | ICD-10-CM | POA: Diagnosis not present

## 2020-01-09 DIAGNOSIS — D509 Iron deficiency anemia, unspecified: Secondary | ICD-10-CM | POA: Diagnosis not present

## 2020-01-09 DIAGNOSIS — E119 Type 2 diabetes mellitus without complications: Secondary | ICD-10-CM | POA: Diagnosis not present

## 2020-01-17 DIAGNOSIS — H3530 Unspecified macular degeneration: Secondary | ICD-10-CM | POA: Diagnosis not present

## 2020-01-17 DIAGNOSIS — D509 Iron deficiency anemia, unspecified: Secondary | ICD-10-CM | POA: Diagnosis not present

## 2020-01-17 DIAGNOSIS — E7800 Pure hypercholesterolemia, unspecified: Secondary | ICD-10-CM | POA: Diagnosis not present

## 2020-01-17 DIAGNOSIS — I10 Essential (primary) hypertension: Secondary | ICD-10-CM | POA: Diagnosis not present

## 2020-01-17 DIAGNOSIS — E119 Type 2 diabetes mellitus without complications: Secondary | ICD-10-CM | POA: Diagnosis not present

## 2020-04-01 ENCOUNTER — Inpatient Hospital Stay: Payer: Medicare Other | Attending: Hematology

## 2020-04-01 ENCOUNTER — Other Ambulatory Visit: Payer: Self-pay

## 2020-04-01 ENCOUNTER — Inpatient Hospital Stay (HOSPITAL_BASED_OUTPATIENT_CLINIC_OR_DEPARTMENT_OTHER): Payer: Medicare Other | Admitting: Hematology

## 2020-04-01 VITALS — BP 164/84 | HR 86 | Temp 98.3°F | Resp 18 | Ht 69.0 in | Wt 251.9 lb

## 2020-04-01 DIAGNOSIS — Z7984 Long term (current) use of oral hypoglycemic drugs: Secondary | ICD-10-CM | POA: Insufficient documentation

## 2020-04-01 DIAGNOSIS — E785 Hyperlipidemia, unspecified: Secondary | ICD-10-CM | POA: Diagnosis not present

## 2020-04-01 DIAGNOSIS — Z79899 Other long term (current) drug therapy: Secondary | ICD-10-CM | POA: Insufficient documentation

## 2020-04-01 DIAGNOSIS — K449 Diaphragmatic hernia without obstruction or gangrene: Secondary | ICD-10-CM | POA: Insufficient documentation

## 2020-04-01 DIAGNOSIS — E119 Type 2 diabetes mellitus without complications: Secondary | ICD-10-CM | POA: Diagnosis not present

## 2020-04-01 DIAGNOSIS — D500 Iron deficiency anemia secondary to blood loss (chronic): Secondary | ICD-10-CM

## 2020-04-01 DIAGNOSIS — K922 Gastrointestinal hemorrhage, unspecified: Secondary | ICD-10-CM | POA: Diagnosis not present

## 2020-04-01 DIAGNOSIS — I10 Essential (primary) hypertension: Secondary | ICD-10-CM | POA: Diagnosis not present

## 2020-04-01 LAB — CBC WITH DIFFERENTIAL/PLATELET
Abs Immature Granulocytes: 0.01 10*3/uL (ref 0.00–0.07)
Basophils Absolute: 0 10*3/uL (ref 0.0–0.1)
Basophils Relative: 1 %
Eosinophils Absolute: 0.2 10*3/uL (ref 0.0–0.5)
Eosinophils Relative: 4 %
HCT: 43.1 % (ref 39.0–52.0)
Hemoglobin: 13.9 g/dL (ref 13.0–17.0)
Immature Granulocytes: 0 %
Lymphocytes Relative: 11 %
Lymphs Abs: 0.6 10*3/uL — ABNORMAL LOW (ref 0.7–4.0)
MCH: 28.1 pg (ref 26.0–34.0)
MCHC: 32.3 g/dL (ref 30.0–36.0)
MCV: 87.2 fL (ref 80.0–100.0)
Monocytes Absolute: 0.6 10*3/uL (ref 0.1–1.0)
Monocytes Relative: 9 %
Neutro Abs: 4.6 10*3/uL (ref 1.7–7.7)
Neutrophils Relative %: 75 %
Platelets: 191 10*3/uL (ref 150–400)
RBC: 4.94 MIL/uL (ref 4.22–5.81)
RDW: 14.4 % (ref 11.5–15.5)
WBC: 6 10*3/uL (ref 4.0–10.5)
nRBC: 0 % (ref 0.0–0.2)

## 2020-04-01 LAB — CMP (CANCER CENTER ONLY)
ALT: 28 U/L (ref 0–44)
AST: 24 U/L (ref 15–41)
Albumin: 3.7 g/dL (ref 3.5–5.0)
Alkaline Phosphatase: 100 U/L (ref 38–126)
Anion gap: 12 (ref 5–15)
BUN: 15 mg/dL (ref 8–23)
CO2: 23 mmol/L (ref 22–32)
Calcium: 9.2 mg/dL (ref 8.9–10.3)
Chloride: 105 mmol/L (ref 98–111)
Creatinine: 0.97 mg/dL (ref 0.61–1.24)
GFR, Est AFR Am: >60 mL/min (ref >60)
GFR, Estimated: >60 mL/min (ref >60)
Glucose, Bld: 113 mg/dL — ABNORMAL HIGH (ref 70–99)
Potassium: 4.3 mmol/L (ref 3.5–5.1)
Sodium: 140 mmol/L (ref 135–145)
Total Bilirubin: 0.8 mg/dL (ref 0.3–1.2)
Total Protein: 7.6 g/dL (ref 6.5–8.1)

## 2020-04-01 LAB — FERRITIN: Ferritin: 414 ng/mL — ABNORMAL HIGH (ref 24–336)

## 2020-04-01 LAB — IRON AND TIBC
Iron: 71 ug/dL (ref 42–163)
Saturation Ratios: 23 % (ref 20–55)
TIBC: 305 ug/dL (ref 202–409)
UIBC: 234 ug/dL (ref 117–376)

## 2020-04-01 NOTE — Progress Notes (Signed)
HEMATOLOGY/ONCOLOGY CLINIC NOTE  Date of Service: 04/01/20  Patient Care Team: [name], MD as PCP - General (Internal Medicine)   Gastroenterology: Dr. Paulita Fujita  CHIEF COMPLAINTS  Continue mx of Iron deficiency Anemia due to ongoing GI bleeding.  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 Paulita Fujita showed benign polyps. He was recommended repeat colonoscopy in 3-5 years.  Current Treatment IV Iron as needed to maintain ferritin >100 B complex 1 cap po daily B12 500 g daily orally  HISTORY OF PRESENTING ILLNESS:  (Plz see initial consultation for details regarding initial presentation)  INTERVAL HISTORY  [name] is here for f/u regarding his iron deficiency anemia. The patient's last visit with [place] was on 10/03/2019. The pt reports that he is doing well overall.  The pt reports that he received the first dose of the COVID19 vaccine on 05/06 and tolerated it well. He had a fall yesterday while trying to help someone else up. Pt sustained no significant injuries from this fall. Pt visits his PCP every 6 months.   Lab results today (04/01/20) of CBC w/diff and CMP is as follows: all values are WNL except for Lymphs Abs at 0.6K, Glucose at 113. 04/01/2020 Iron Panel is as follows: Iron at 71, TIBC at 305, Sat Ratios at 23, UIBC at 234 04/01/2020 Ferritin at 414  On review of systems, pt reports bruising and denies low appetite, abdominal pain, abnormal bleeding and any other symptoms.   MEDICAL HISTORY:  Past Medical History:  Diagnosis Date  . Anemia   . Diabetes mellitus without complication ([place])   . Hyperlipidemia   . Hypertension   . Iron deficiency   . Macular degeneration   . Meningitis     SURGICAL HISTORY: Past Surgical History:  Procedure Laterality Date  . APPENDECTOMY    . COLONOSCOPY     . ESOPHAGOGASTRODUODENOSCOPY    . ESOPHAGOGASTRODUODENOSCOPY (EGD) WITH PROPOFOL N/A 12/10/2015   Procedure: ESOPHAGOGASTRODUODENOSCOPY (EGD) WITH PROPOFOL;  Surgeon: Wilford Corner, MD;  Location: [facility] ENDOSCOPY;  Service: Gastroenterology;  Laterality: N/A;  . GIVENS CAPSULE STUDY N/A 11/10/2015   Procedure: GIVENS CAPSULE STUDY;  Surgeon: Arta Silence, MD;  Location[facility] ENDOSCOPY;  Service: Endoscopy;  Laterality: N/A;  . HERNIA REPAIR    . TONSILLECTOMY      SOCIAL HISTORY: Social History   Socioeconomic History  . Marital status: Single    Spouse name: Not on file  . Number of children: Not on file  . Years of education: Not on file  . Highest education level: Not on file  Occupational History  . Not on file  Tobacco Use  . Smoking status: Never Smoker  . Smokeless tobacco: Never Used  Substance and Sexual Activity  . Alcohol use: No    Alcohol/week: 0.0 standard drinks  . Drug use: No  . Sexual activity: Never  Other Topics Concern  . Not on file  Social History Narrative  . Not on file   Social Determinants of Health   Financial Resource Strain:   . Difficulty of Paying Living Expenses:   Food Insecurity:   . Worried About Charity fundraiser in the Last Year:   . Arboriculturist in the Last Year:   Transportation Needs:   . Film/video editor (Medical):   Marland Kitchen Lack of  Transportation (Non-Medical):   Physical Activity:   . Days of Exercise per Week:   . Minutes of Exercise per Session:   Stress:   . Feeling of Stress :   Social Connections:   . Frequency of Communication with Friends and Family:   . Frequency of Social Gatherings with Friends and Family:   . Attends Religious Services:   . Active Member of Clubs or Organizations:   . Attends Archivist Meetings:   Marland Kitchen Marital Status:   Intimate Partner Violence:   . Fear of Current or Ex-Partner:   . Emotionally Abused:   Marland Kitchen Physically Abused:   . Sexually Abused:     FAMILY  HISTORY: Family History  Problem Relation Age of Onset  . CAD Mother     ALLERGIES:  has No Known Allergies.  MEDICATIONS:  Current Outpatient Medications  Medication Sig Dispense Refill  . ACCU-CHEK AVIVA PLUS test strip     . amLODipine (NORVASC) 5 MG tablet Take 5 mg by mouth every evening.     Marland Kitchen atorvastatin (LIPITOR) 80 MG tablet Take 80 mg by mouth daily at 6 PM.     . B Complex-C-E-Zn (STRESS-600/ZINC PO) Take 1 tablet by mouth daily.    Marland Kitchen CALCIUM-MAGNESIUM-VITAMIN D ER PO Take by mouth.    . docusate sodium (COLACE) 100 MG capsule Take 2 capsules (200 mg total) by mouth at bedtime as needed for mild constipation or moderate constipation. 60 capsule 0  . glimepiride (AMARYL) 2 MG tablet Take 2 mg by mouth daily with breakfast.    . metFORMIN (GLUCOPHAGE) 500 MG tablet Take 500 mg by mouth every evening.     . Multiple Vitamins-Minerals (ICAPS) CAPS Take 1 capsule by mouth daily.    Marland Kitchen omeprazole (PRILOSEC) 20 MG capsule Take 20 mg by mouth daily.     . pioglitazone (ACTOS) 15 MG tablet      No current facility-administered medications for this visit.   Facility-Administered Medications Ordered in Other Visits  Medication Dose Route Frequency Provider Last Rate Last Admin  . 0.9 %  sodium chloride infusion   Intravenous Once [name], MD      . acetaminophen (TYLENOL) tablet 650 mg  650 mg Oral Once [name], MD      . diphenhydrAMINE (BENADRYL) capsule 25 mg  25 mg Oral Once [name], MD        REVIEW OF SYSTEMS:   A 10+ POINT REVIEW OF SYSTEMS WAS OBTAINED including neurology, dermatology, psychiatry, cardiac, respiratory, lymph, extremities, GI, GU, Musculoskeletal, constitutional, breasts, reproductive, HEENT.  All pertinent positives are noted in the HPI.  All others are negative.   PHYSICAL EXAMINATION: ECOG PERFORMANCE STATUS: 2 - Symptomatic, <50% confined to bed   Vitals:   07/21/17 1015  Weight: 251 lb 3.2 oz (113.9 kg)  Height: 5\' 10"  (1.778 m)     Exam was given in a chair   GENERAL:alert, in no acute distress and comfortable SKIN: no acute rashes, no significant lesions EYES: conjunctiva are pink and non-injected, sclera anicteric OROPHARYNX: MMM, no exudates, no oropharyngeal erythema or ulceration NECK: supple, no JVD LYMPH:  no palpable lymphadenopathy in the cervical, axillary or inguinal regions LUNGS: clear to auscultation b/l with normal respiratory effort HEART: regular rate & rhythm ABDOMEN:  normoactive bowel sounds , non tender, not distended. No palpable hepatosplenomegaly.  Extremity: no pedal edema PSYCH: alert & oriented x 3 with fluent speech NEURO: no focal motor/sensory deficits  LABORATORY DATA:  I have reviewed the data as listed  . CBC Latest Ref Rng & Units 04/01/2020 10/03/2019 05/30/2019  WBC 4.0 - 10.5 K/uL 6.0 6.5 6.6  Hemoglobin 13.0 - 17.0 g/dL 13.9 14.1 14.2  Hematocrit 39.0 - 52.0 % 43.1 43.9 44.2  Platelets 150 - 400 K/uL 191 202 218   . CBC    Component Value Date/Time   WBC 6.0 04/01/2020 0853   RBC 4.94 04/01/2020 0853   HGB 13.9 04/01/2020 0853   HGB 13.0 01/27/2018 0930   HGB 13.3 10/27/2017 0958   HCT 43.1 04/01/2020 0853   HCT 40.7 10/27/2017 0958   PLT 191 04/01/2020 0853   PLT 260 01/27/2018 0930   PLT 230 10/27/2017 0958   MCV 87.2 04/01/2020 0853   MCV 89.6 10/27/2017 0958   MCH 28.1 04/01/2020 0853   MCHC 32.3 04/01/2020 0853   RDW 14.4 04/01/2020 0853   RDW 14.6 10/27/2017 0958   LYMPHSABS 0.6 (L) 04/01/2020 0853   LYMPHSABS 0.5 (L) 10/27/2017 0958   MONOABS 0.6 04/01/2020 0853   MONOABS 0.3 10/27/2017 0958   EOSABS 0.2 04/01/2020 0853   EOSABS 0.2 10/27/2017 0958   BASOSABS 0.0 04/01/2020 0853   BASOSABS 0.0 10/27/2017 0958   . CMP Latest Ref Rng & Units 04/01/2020 05/30/2019 01/31/2019  Glucose 70 - 99 mg/dL 113(H) 97 98  BUN 8 - 23 mg/dL 15 14 14   Creatinine 0.61 - 1.24 mg/dL 0.97 0.94 0.93  Sodium 135 - 145 mmol/L 140 140 140  Potassium 3.5 - 5.1 mmol/L  4.3 4.2 4.4  Chloride 98 - 111 mmol/L 105 105 105  CO2 22 - 32 mmol/L 23 25 24   Calcium 8.9 - 10.3 mg/dL 9.2 8.6(L) 9.0  Total Protein 6.5 - 8.1 g/dL 7.6 7.4 7.2  Total Bilirubin 0.3 - 1.2 mg/dL 0.8 0.5 0.6  Alkaline Phos 38 - 126 U/L 100 109 129(H)  AST 15 - 41 U/L 24 21 18   ALT 0 - 44 U/L 28 23 19    . Lab Results  Component Value Date   IRON 71 04/01/2020   TIBC 305 04/01/2020   IRONPCTSAT 23 04/01/2020   (Iron and TIBC)  Lab Results  Component Value Date   FERRITIN 414 (H) 04/01/2020     RADIOGRAPHIC STUDIES: I have personally reviewed the radiological images as listed and agreed with the findings in the report. No results found.  RADIOGRAPHIC STUDIES: I have personally reviewed the radiological images as listed and agreed with the findings in the report. No results found.  ASSESSMENT & PLAN:   79 y.o. male with   #1 Microcytic hypochromic anemia. Iron deficiency anemia recurrent and likely due to ongoing GI blood losses.    The source of GI bleeding thought to be his Ulcers related to his hiatal hernia . Patient has refused surgical option to address his hiatal hernia with ulceration causing recurrent chronic GI bleeds. Platelet function, PT and PTT were within normal limits and suggest against the presence of a hemostatic defect.  PLAN: -Discussed pt labwork today, 04/01/20; no anemia, WBC and PLT are nml, blood chemistries are nml, Ferritin is stable, Iron Panel is WNL -Ferritin at goal which is >100. No current indication for IV Iron  -Pt does not appear to still be bleeding from his GI tract based lab/clinical evidence -Continue PO Vitamin B12 and B-complex vitamin -Continue PPI twice a day pre-meals. -Absolutely avoid NSAIDs and other blood thinners.  -Recommend pt continue f/u with PCP and GI. -Will see  back in 12 months with labs   FOLLOW UP: RTC with Dr Irene Limbo with labs in 12 months   The total time spent in the appt was 20 minutes and more than 50%  was on counseling and direct patient cares.  All of the patient's questions were answered with apparent satisfaction. The patient knows to call the clinic with any problems, questions or concerns.   Sullivan Lone MD [place] AAHIVMS [facility] Hematology/Oncology Physician [facility]  (Office):       [phone] (Work cell):  [phone] (Fax):           [phone]  I, Yevette Edwards, am acting as a scribe for Dr. Sullivan Lone.   .I have reviewed the above documentation for accuracy and completeness, and I agree with the above. Brunetta Genera MD

## 2020-04-02 ENCOUNTER — Telehealth: Payer: Self-pay | Admitting: Hematology

## 2020-04-02 NOTE — Telephone Encounter (Signed)
Scheduled per 05/11 los, patient has been called and notified.

## 2020-07-08 DIAGNOSIS — E113293 Type 2 diabetes mellitus with mild nonproliferative diabetic retinopathy without macular edema, bilateral: Secondary | ICD-10-CM | POA: Diagnosis not present

## 2020-07-08 DIAGNOSIS — H40013 Open angle with borderline findings, low risk, bilateral: Secondary | ICD-10-CM | POA: Diagnosis not present

## 2020-07-08 DIAGNOSIS — H353132 Nonexudative age-related macular degeneration, bilateral, intermediate dry stage: Secondary | ICD-10-CM | POA: Diagnosis not present

## 2020-07-09 DIAGNOSIS — D509 Iron deficiency anemia, unspecified: Secondary | ICD-10-CM | POA: Diagnosis not present

## 2020-07-09 DIAGNOSIS — Z0000 Encounter for general adult medical examination without abnormal findings: Secondary | ICD-10-CM | POA: Diagnosis not present

## 2020-07-09 DIAGNOSIS — E7800 Pure hypercholesterolemia, unspecified: Secondary | ICD-10-CM | POA: Diagnosis not present

## 2020-07-09 DIAGNOSIS — I10 Essential (primary) hypertension: Secondary | ICD-10-CM | POA: Diagnosis not present

## 2020-07-09 DIAGNOSIS — E119 Type 2 diabetes mellitus without complications: Secondary | ICD-10-CM | POA: Diagnosis not present

## 2020-07-16 DIAGNOSIS — Z23 Encounter for immunization: Secondary | ICD-10-CM | POA: Diagnosis not present

## 2020-07-16 DIAGNOSIS — E7800 Pure hypercholesterolemia, unspecified: Secondary | ICD-10-CM | POA: Diagnosis not present

## 2020-07-16 DIAGNOSIS — E119 Type 2 diabetes mellitus without complications: Secondary | ICD-10-CM | POA: Diagnosis not present

## 2020-07-16 DIAGNOSIS — I10 Essential (primary) hypertension: Secondary | ICD-10-CM | POA: Diagnosis not present

## 2020-07-16 DIAGNOSIS — H3530 Unspecified macular degeneration: Secondary | ICD-10-CM | POA: Diagnosis not present

## 2020-07-16 DIAGNOSIS — D509 Iron deficiency anemia, unspecified: Secondary | ICD-10-CM | POA: Diagnosis not present

## 2020-08-14 DIAGNOSIS — Z23 Encounter for immunization: Secondary | ICD-10-CM | POA: Diagnosis not present

## 2020-12-09 DIAGNOSIS — S61401A Unspecified open wound of right hand, initial encounter: Secondary | ICD-10-CM | POA: Diagnosis not present

## 2021-01-07 DIAGNOSIS — I10 Essential (primary) hypertension: Secondary | ICD-10-CM | POA: Diagnosis not present

## 2021-01-07 DIAGNOSIS — R946 Abnormal results of thyroid function studies: Secondary | ICD-10-CM | POA: Diagnosis not present

## 2021-01-07 DIAGNOSIS — D509 Iron deficiency anemia, unspecified: Secondary | ICD-10-CM | POA: Diagnosis not present

## 2021-01-07 DIAGNOSIS — E119 Type 2 diabetes mellitus without complications: Secondary | ICD-10-CM | POA: Diagnosis not present

## 2021-01-07 DIAGNOSIS — E7800 Pure hypercholesterolemia, unspecified: Secondary | ICD-10-CM | POA: Diagnosis not present

## 2021-01-13 DIAGNOSIS — E113293 Type 2 diabetes mellitus with mild nonproliferative diabetic retinopathy without macular edema, bilateral: Secondary | ICD-10-CM | POA: Diagnosis not present

## 2021-01-13 DIAGNOSIS — H40013 Open angle with borderline findings, low risk, bilateral: Secondary | ICD-10-CM | POA: Diagnosis not present

## 2021-01-14 DIAGNOSIS — Z0000 Encounter for general adult medical examination without abnormal findings: Secondary | ICD-10-CM | POA: Diagnosis not present

## 2021-01-14 DIAGNOSIS — Z23 Encounter for immunization: Secondary | ICD-10-CM | POA: Diagnosis not present

## 2021-01-14 DIAGNOSIS — I10 Essential (primary) hypertension: Secondary | ICD-10-CM | POA: Diagnosis not present

## 2021-01-14 DIAGNOSIS — E119 Type 2 diabetes mellitus without complications: Secondary | ICD-10-CM | POA: Diagnosis not present

## 2021-01-14 DIAGNOSIS — E7800 Pure hypercholesterolemia, unspecified: Secondary | ICD-10-CM | POA: Diagnosis not present

## 2021-01-14 DIAGNOSIS — D509 Iron deficiency anemia, unspecified: Secondary | ICD-10-CM | POA: Diagnosis not present

## 2021-03-31 NOTE — Progress Notes (Signed)
HEMATOLOGY/ONCOLOGY CLINIC NOTE  Date of Service: 04/01/21  Patient Care Team: [name], MD as PCP - General (Internal Medicine)   Gastroenterology: Dr. Paulita Fujita  CHIEF COMPLAINTS  Continue mx of Iron deficiency Anemia due to ongoing GI bleeding.  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 Paulita Fujita showed benign polyps. He was recommended repeat colonoscopy in 3-5 years.  Current Treatment IV Iron as needed to maintain ferritin >100 B complex 1 cap po daily B12 500 g daily orally  HISTORY OF PRESENTING ILLNESS:  (Plz see initial consultation for details regarding initial presentation)  INTERVAL HISTORY  James Cooke is here for f/u regarding his iron deficiency anemia. The patient's last visit with [place] was on 04/01/2020. The pt reports that he is doing well overall.  The pt reports that he recently had a physical with his new PCP Dr. Theda Sers and everything passed well. They noted no concerns. The pt notes no new symptoms or concerns over the last year. The pt notes he has received his Tetanus, Pneumovax, and Shingrix vaccine.  Lab results today 04/01/2021 of CBC w/diff and CMP is as follows: all values are WNL. 04/01/2021 Iron pending. 04/01/2021 Ferritin pending.  On review of systems, pt denies bleeding issues, blood in stools or urine, black stools, changes in bowel habits, fatigue, and any other symptoms.  MEDICAL HISTORY:  Past Medical History:  Diagnosis Date  . Anemia   . Diabetes mellitus without complication ([place])   . Hyperlipidemia   . Hypertension   . Iron deficiency   . Macular degeneration   . Meningitis     SURGICAL HISTORY: Past Surgical History:  Procedure Laterality Date  . APPENDECTOMY    . COLONOSCOPY    . ESOPHAGOGASTRODUODENOSCOPY    . ESOPHAGOGASTRODUODENOSCOPY (EGD) WITH  PROPOFOL N/A 12/10/2015   Procedure: ESOPHAGOGASTRODUODENOSCOPY (EGD) WITH PROPOFOL;  Surgeon: Wilford Corner, MD;  Location: [facility] ENDOSCOPY;  Service: Gastroenterology;  Laterality: N/A;  . GIVENS CAPSULE STUDY N/A 11/10/2015   Procedure: GIVENS CAPSULE STUDY;  Surgeon: Arta Silence, MD;  Location: [facility] ENDOSCOPY;  Service: Endoscopy;  Laterality: N/A;  . HERNIA REPAIR    . TONSILLECTOMY      SOCIAL HISTORY: Social History   Socioeconomic History  . Marital status: Single    Spouse name: Not on file  . Number of children: Not on file  . Years of education: Not on file  . Highest education level: Not on file  Occupational History  . Not on file  Tobacco Use  . Smoking status: Never Smoker  . Smokeless tobacco: Never Used  Vaping Use  . Vaping Use: Never used  Substance and Sexual Activity  . Alcohol use: No    Alcohol/week: 0.0 standard drinks  . Drug use: No  . Sexual activity: Never  Other Topics Concern  . Not on file  Social History Narrative  . Not on file   Social Determinants of Health   Financial Resource Strain: Not on file  Food Insecurity: Not on file  Transportation Needs: Not on file  Physical Activity: Not on file  Stress: Not on file  Social Connections: Not on file  Intimate Partner Violence: Not on file    FAMILY HISTORY: Family History  Problem Relation Age of Onset  . CAD Mother     ALLERGIES:  has No Known  Allergies.  MEDICATIONS:  Current Outpatient Medications  Medication Sig Dispense Refill  . ACCU-CHEK AVIVA PLUS test strip     . amLODipine (NORVASC) 5 MG tablet Take 5 mg by mouth every evening.     Marland Kitchen atorvastatin (LIPITOR) 80 MG tablet Take 80 mg by mouth daily at 6 PM.     . B Complex-C-E-Zn (STRESS-600/ZINC PO) Take 1 tablet by mouth daily.    Marland Kitchen CALCIUM-MAGNESIUM-VITAMIN D ER PO Take by mouth.    . docusate sodium (COLACE) 100 MG capsule Take 2 capsules (200 mg total) by mouth at bedtime as needed for mild constipation or moderate  constipation. 60 capsule 0  . glimepiride (AMARYL) 2 MG tablet Take 2 mg by mouth daily with breakfast.    . metFORMIN (GLUCOPHAGE) 500 MG tablet Take 500 mg by mouth every evening.     . Multiple Vitamins-Minerals (ICAPS) CAPS Take 1 capsule by mouth daily.    Marland Kitchen omeprazole (PRILOSEC) 20 MG capsule Take 20 mg by mouth daily.     . pioglitazone (ACTOS) 15 MG tablet      No current facility-administered medications for this visit.   Facility-Administered Medications Ordered in Other Visits  Medication Dose Route Frequency Provider Last Rate Last Admin  . 0.9 %  sodium chloride infusion   Intravenous Once [name], MD      . acetaminophen (TYLENOL) tablet 650 mg  650 mg Oral Once [name], MD      . diphenhydrAMINE (BENADRYL) capsule 25 mg  25 mg Oral Once [name], MD        REVIEW OF SYSTEMS:   10 Point review of Systems was done is negative except as noted above.  PHYSICAL EXAMINATION: ECOG PERFORMANCE STATUS: 2 - Symptomatic, <50% confined to bed Today's Vitals   04/01/21 1106  BP: (!) 160/82  Pulse: 79  Resp: 18  Temp: 97.8 F (36.6 C)  TempSrc: Tympanic  SpO2: 95%  Weight: 253 lb 14.4 oz (115.2 kg)  Height: 5\' 9"  (1.753 m)     Vitals:   07/21/17 1015  Weight: 251 lb 3.2 oz (113.9 kg)  Height: 5\' 10"  (1.778 m)    Exam was given in a chair.  GENERAL:alert, in no acute distress and comfortable SKIN: no acute rashes, no significant lesions EYES: conjunctiva are pink and non-injected, sclera anicteric OROPHARYNX: MMM, no exudates, no oropharyngeal erythema or ulceration NECK: supple, no JVD LYMPH:  no palpable lymphadenopathy in the cervical, axillary or inguinal regions LUNGS: clear to auscultation b/l with normal respiratory effort HEART: regular rate & rhythm ABDOMEN:  normoactive bowel sounds , non tender, not distended. No palpable hepatosplenomegaly.  Extremity: no pedal edema PSYCH: alert & oriented x 3 with fluent speech NEURO: no focal motor/sensory  deficits  LABORATORY DATA:  I have reviewed the data as listed  . CBC Latest Ref Rng & Units 04/01/2021 04/01/2020 10/03/2019  WBC 4.0 - 10.5 K/uL 7.1 6.0 6.5  Hemoglobin 13.0 - 17.0 g/dL 14.0 13.9 14.1  Hematocrit 39.0 - 52.0 % 43.3 43.1 43.9  Platelets 150 - 400 K/uL 202 191 202   . CBC    Component Value Date/Time   WBC 7.1 04/01/2021 0937   RBC 4.92 04/01/2021 0937   HGB 14.0 04/01/2021 0937   HGB 13.0 01/27/2018 0930   HGB 13.3 10/27/2017 0958   HCT 43.3 04/01/2021 0937   HCT 40.7 10/27/2017 0958   PLT 202 04/01/2021 0937   PLT 260 01/27/2018 0930   PLT 230  10/27/2017 0958   MCV 88.0 04/01/2021 0937   MCV 89.6 10/27/2017 0958   MCH 28.5 04/01/2021 0937   MCHC 32.3 04/01/2021 0937   RDW 14.3 04/01/2021 0937   RDW 14.6 10/27/2017 0958   LYMPHSABS 0.7 04/01/2021 0937   LYMPHSABS 0.5 (L) 10/27/2017 0958   MONOABS 0.5 04/01/2021 0937   MONOABS 0.3 10/27/2017 0958   EOSABS 0.3 04/01/2021 0937   EOSABS 0.2 10/27/2017 0958   BASOSABS 0.1 04/01/2021 0937   BASOSABS 0.0 10/27/2017 0958   . CMP Latest Ref Rng & Units 04/01/2021 04/01/2020 05/30/2019  Glucose 70 - 99 mg/dL 96 113(H) 97  BUN 8 - 23 mg/dL 19 15 14   Creatinine 0.61 - 1.24 mg/dL 0.93 0.97 0.94  Sodium 135 - 145 mmol/L 140 140 140  Potassium 3.5 - 5.1 mmol/L 4.3 4.3 4.2  Chloride 98 - 111 mmol/L 105 105 105  CO2 22 - 32 mmol/L 26 23 25   Calcium 8.9 - 10.3 mg/dL 9.4 9.2 8.6(L)  Total Protein 6.5 - 8.1 g/dL 7.3 7.6 7.4  Total Bilirubin 0.3 - 1.2 mg/dL 0.7 0.8 0.5  Alkaline Phos 38 - 126 U/L 85 100 109  AST 15 - 41 U/L 23 24 21   ALT 0 - 44 U/L 26 28 23    . Lab Results  Component Value Date   IRON 79 04/01/2021   TIBC 311 04/01/2021   IRONPCTSAT 25 04/01/2021   (Iron and TIBC)  Lab Results  Component Value Date   FERRITIN 254 04/01/2021     RADIOGRAPHIC STUDIES: I have personally reviewed the radiological images as listed and agreed with the findings in the report. No results found.  RADIOGRAPHIC  STUDIES: I have personally reviewed the radiological images as listed and agreed with the findings in the report. No results found.  ASSESSMENT & PLAN:   80 y.o. male with   #1 Microcytic hypochromic anemia. Iron deficiency anemia recurrent and likely due to ongoing GI blood losses.    The source of GI bleeding thought to be his Ulcers related to his hiatal hernia . Patient has refused surgical option to address his hiatal hernia with ulceration causing recurrent chronic GI bleeds. Platelet function, PT and PTT were within normal limits and suggest against the presence of a hemostatic defect.  PLAN: -Discussed pt labwork today, 04/01/2021; blood counts and chemistries all completely normal. Iron labs pending. -Advised pt that we can consolidate care and discharge to PCP at this time due to not needing iron for over a year. The pt wishes to wait until the next visit. -Recommended pt receive the second COVID booster shot as recently approved. The pt plans to get this in June. -Continue PO Vitamin B12 and B-complex vitamin -Continue PPI twice a day pre-meals. -Absolutely avoid NSAIDs and other blood thinners.  -Recommend pt continue f/u with PCP and GI. -Will see back in 12 months with labs.   FOLLOW UP: RTC with Dr Irene Limbo with labs in 12 months   The total time spent in the appt was 20 minutes and more than 50% was on counseling and direct patient cares.  All of the patient's questions were answered with apparent satisfaction. The patient knows to call the clinic with any problems, questions or concerns.   Sullivan Lone MD [place] AAHIVMS [facility] Hematology/Oncology Physician [facility]  (Office):       [phone] (Work cell):  [phone] (Fax):           [phone]  I, Harrington Challenger  Jillyn Hidden, am acting as scribe for Dr. Sullivan Lone, MD.   .I have reviewed the above documentation for accuracy and completeness, and I agree with the above. Brunetta Genera MD

## 2021-04-01 ENCOUNTER — Other Ambulatory Visit: Payer: Self-pay

## 2021-04-01 ENCOUNTER — Inpatient Hospital Stay: Payer: Medicare Other | Attending: Hematology

## 2021-04-01 ENCOUNTER — Inpatient Hospital Stay: Payer: Medicare Other | Admitting: Hematology

## 2021-04-01 VITALS — BP 160/82 | HR 79 | Temp 97.8°F | Resp 18 | Ht 69.0 in | Wt 253.9 lb

## 2021-04-01 DIAGNOSIS — D500 Iron deficiency anemia secondary to blood loss (chronic): Secondary | ICD-10-CM

## 2021-04-01 DIAGNOSIS — E785 Hyperlipidemia, unspecified: Secondary | ICD-10-CM | POA: Diagnosis not present

## 2021-04-01 DIAGNOSIS — D509 Iron deficiency anemia, unspecified: Secondary | ICD-10-CM | POA: Diagnosis not present

## 2021-04-01 DIAGNOSIS — Z8711 Personal history of peptic ulcer disease: Secondary | ICD-10-CM | POA: Diagnosis not present

## 2021-04-01 DIAGNOSIS — Z7984 Long term (current) use of oral hypoglycemic drugs: Secondary | ICD-10-CM | POA: Insufficient documentation

## 2021-04-01 DIAGNOSIS — I10 Essential (primary) hypertension: Secondary | ICD-10-CM | POA: Diagnosis not present

## 2021-04-01 DIAGNOSIS — E119 Type 2 diabetes mellitus without complications: Secondary | ICD-10-CM | POA: Diagnosis not present

## 2021-04-01 DIAGNOSIS — Z79899 Other long term (current) drug therapy: Secondary | ICD-10-CM | POA: Insufficient documentation

## 2021-04-01 DIAGNOSIS — K922 Gastrointestinal hemorrhage, unspecified: Secondary | ICD-10-CM | POA: Insufficient documentation

## 2021-04-01 LAB — CMP (CANCER CENTER ONLY)
ALT: 26 U/L (ref 0–44)
AST: 23 U/L (ref 15–41)
Albumin: 3.6 g/dL (ref 3.5–5.0)
Alkaline Phosphatase: 85 U/L (ref 38–126)
Anion gap: 9 (ref 5–15)
BUN: 19 mg/dL (ref 8–23)
CO2: 26 mmol/L (ref 22–32)
Calcium: 9.4 mg/dL (ref 8.9–10.3)
Chloride: 105 mmol/L (ref 98–111)
Creatinine: 0.93 mg/dL (ref 0.61–1.24)
GFR, Estimated: >60 mL/min (ref >60)
Glucose, Bld: 96 mg/dL (ref 70–99)
Potassium: 4.3 mmol/L (ref 3.5–5.1)
Sodium: 140 mmol/L (ref 135–145)
Total Bilirubin: 0.7 mg/dL (ref 0.3–1.2)
Total Protein: 7.3 g/dL (ref 6.5–8.1)

## 2021-04-01 LAB — CBC WITH DIFFERENTIAL/PLATELET
Abs Immature Granulocytes: 0.02 10*3/uL (ref 0.00–0.07)
Basophils Absolute: 0.1 10*3/uL (ref 0.0–0.1)
Basophils Relative: 1 %
Eosinophils Absolute: 0.3 10*3/uL (ref 0.0–0.5)
Eosinophils Relative: 5 %
HCT: 43.3 % (ref 39.0–52.0)
Hemoglobin: 14 g/dL (ref 13.0–17.0)
Immature Granulocytes: 0 %
Lymphocytes Relative: 11 %
Lymphs Abs: 0.7 10*3/uL (ref 0.7–4.0)
MCH: 28.5 pg (ref 26.0–34.0)
MCHC: 32.3 g/dL (ref 30.0–36.0)
MCV: 88 fL (ref 80.0–100.0)
Monocytes Absolute: 0.5 10*3/uL (ref 0.1–1.0)
Monocytes Relative: 8 %
Neutro Abs: 5.4 10*3/uL (ref 1.7–7.7)
Neutrophils Relative %: 75 %
Platelets: 202 10*3/uL (ref 150–400)
RBC: 4.92 MIL/uL (ref 4.22–5.81)
RDW: 14.3 % (ref 11.5–15.5)
WBC: 7.1 10*3/uL (ref 4.0–10.5)
nRBC: 0 % (ref 0.0–0.2)

## 2021-04-01 LAB — IRON AND TIBC
Iron: 79 ug/dL (ref 42–163)
Saturation Ratios: 25 % (ref 20–55)
TIBC: 311 ug/dL (ref 202–409)
UIBC: 233 ug/dL (ref 117–376)

## 2021-04-01 LAB — FERRITIN: Ferritin: 254 ng/mL (ref 24–336)

## 2021-07-09 DIAGNOSIS — E7800 Pure hypercholesterolemia, unspecified: Secondary | ICD-10-CM | POA: Diagnosis not present

## 2021-07-09 DIAGNOSIS — E119 Type 2 diabetes mellitus without complications: Secondary | ICD-10-CM | POA: Diagnosis not present

## 2021-07-09 DIAGNOSIS — I10 Essential (primary) hypertension: Secondary | ICD-10-CM | POA: Diagnosis not present

## 2021-07-09 DIAGNOSIS — D509 Iron deficiency anemia, unspecified: Secondary | ICD-10-CM | POA: Diagnosis not present

## 2021-07-11 DIAGNOSIS — R55 Syncope and collapse: Secondary | ICD-10-CM | POA: Diagnosis not present

## 2021-07-11 DIAGNOSIS — R0902 Hypoxemia: Secondary | ICD-10-CM | POA: Diagnosis not present

## 2021-07-11 DIAGNOSIS — T679XXA Effect of heat and light, unspecified, initial encounter: Secondary | ICD-10-CM | POA: Diagnosis not present

## 2021-07-11 DIAGNOSIS — R231 Pallor: Secondary | ICD-10-CM | POA: Diagnosis not present

## 2021-07-11 DIAGNOSIS — I10 Essential (primary) hypertension: Secondary | ICD-10-CM | POA: Diagnosis not present

## 2021-07-15 DIAGNOSIS — I10 Essential (primary) hypertension: Secondary | ICD-10-CM | POA: Diagnosis not present

## 2021-07-15 DIAGNOSIS — E119 Type 2 diabetes mellitus without complications: Secondary | ICD-10-CM | POA: Diagnosis not present

## 2021-07-15 DIAGNOSIS — E7800 Pure hypercholesterolemia, unspecified: Secondary | ICD-10-CM | POA: Diagnosis not present

## 2021-07-15 DIAGNOSIS — D509 Iron deficiency anemia, unspecified: Secondary | ICD-10-CM | POA: Diagnosis not present

## 2021-07-16 DIAGNOSIS — H40013 Open angle with borderline findings, low risk, bilateral: Secondary | ICD-10-CM | POA: Diagnosis not present

## 2021-07-16 DIAGNOSIS — H353132 Nonexudative age-related macular degeneration, bilateral, intermediate dry stage: Secondary | ICD-10-CM | POA: Diagnosis not present

## 2021-07-16 DIAGNOSIS — H524 Presbyopia: Secondary | ICD-10-CM | POA: Diagnosis not present

## 2021-07-16 DIAGNOSIS — E119 Type 2 diabetes mellitus without complications: Secondary | ICD-10-CM | POA: Diagnosis not present

## 2021-07-16 DIAGNOSIS — H35033 Hypertensive retinopathy, bilateral: Secondary | ICD-10-CM | POA: Diagnosis not present

## 2022-01-13 DIAGNOSIS — E119 Type 2 diabetes mellitus without complications: Secondary | ICD-10-CM | POA: Diagnosis not present

## 2022-01-13 DIAGNOSIS — D509 Iron deficiency anemia, unspecified: Secondary | ICD-10-CM | POA: Diagnosis not present

## 2022-01-13 DIAGNOSIS — E7800 Pure hypercholesterolemia, unspecified: Secondary | ICD-10-CM | POA: Diagnosis not present

## 2022-01-13 DIAGNOSIS — I10 Essential (primary) hypertension: Secondary | ICD-10-CM | POA: Diagnosis not present

## 2022-01-19 DIAGNOSIS — M5136 Other intervertebral disc degeneration, lumbar region: Secondary | ICD-10-CM | POA: Diagnosis not present

## 2022-01-19 DIAGNOSIS — D509 Iron deficiency anemia, unspecified: Secondary | ICD-10-CM | POA: Diagnosis not present

## 2022-01-19 DIAGNOSIS — H3530 Unspecified macular degeneration: Secondary | ICD-10-CM | POA: Diagnosis not present

## 2022-01-19 DIAGNOSIS — Z23 Encounter for immunization: Secondary | ICD-10-CM | POA: Diagnosis not present

## 2022-01-19 DIAGNOSIS — E119 Type 2 diabetes mellitus without complications: Secondary | ICD-10-CM | POA: Diagnosis not present

## 2022-01-19 DIAGNOSIS — I10 Essential (primary) hypertension: Secondary | ICD-10-CM | POA: Diagnosis not present

## 2022-01-19 DIAGNOSIS — Z0000 Encounter for general adult medical examination without abnormal findings: Secondary | ICD-10-CM | POA: Diagnosis not present

## 2022-01-27 DIAGNOSIS — H40013 Open angle with borderline findings, low risk, bilateral: Secondary | ICD-10-CM | POA: Diagnosis not present

## 2022-03-30 ENCOUNTER — Other Ambulatory Visit: Payer: Self-pay

## 2022-03-30 DIAGNOSIS — D500 Iron deficiency anemia secondary to blood loss (chronic): Secondary | ICD-10-CM

## 2022-04-01 ENCOUNTER — Inpatient Hospital Stay: Payer: Medicare Other | Admitting: Hematology

## 2022-04-01 ENCOUNTER — Inpatient Hospital Stay: Payer: Medicare Other | Attending: Hematology

## 2022-04-01 ENCOUNTER — Other Ambulatory Visit: Payer: Self-pay

## 2022-04-01 VITALS — BP 145/74 | HR 94 | Temp 97.7°F | Resp 18 | Wt 232.2 lb

## 2022-04-01 DIAGNOSIS — Z8711 Personal history of peptic ulcer disease: Secondary | ICD-10-CM | POA: Diagnosis not present

## 2022-04-01 DIAGNOSIS — E119 Type 2 diabetes mellitus without complications: Secondary | ICD-10-CM | POA: Diagnosis not present

## 2022-04-01 DIAGNOSIS — K449 Diaphragmatic hernia without obstruction or gangrene: Secondary | ICD-10-CM | POA: Diagnosis not present

## 2022-04-01 DIAGNOSIS — D509 Iron deficiency anemia, unspecified: Secondary | ICD-10-CM | POA: Diagnosis not present

## 2022-04-01 DIAGNOSIS — D500 Iron deficiency anemia secondary to blood loss (chronic): Secondary | ICD-10-CM

## 2022-04-01 DIAGNOSIS — Z79899 Other long term (current) drug therapy: Secondary | ICD-10-CM | POA: Diagnosis not present

## 2022-04-01 DIAGNOSIS — I10 Essential (primary) hypertension: Secondary | ICD-10-CM | POA: Insufficient documentation

## 2022-04-01 DIAGNOSIS — E785 Hyperlipidemia, unspecified: Secondary | ICD-10-CM | POA: Diagnosis not present

## 2022-04-01 DIAGNOSIS — Z7984 Long term (current) use of oral hypoglycemic drugs: Secondary | ICD-10-CM | POA: Insufficient documentation

## 2022-04-01 LAB — CBC WITH DIFFERENTIAL (CANCER CENTER ONLY)
Abs Immature Granulocytes: 0.01 K/uL (ref 0.00–0.07)
Basophils Absolute: 0.1 K/uL (ref 0.0–0.1)
Basophils Relative: 1 %
Eosinophils Absolute: 0.3 K/uL (ref 0.0–0.5)
Eosinophils Relative: 5 %
HCT: 42.7 % (ref 39.0–52.0)
Hemoglobin: 14.4 g/dL (ref 13.0–17.0)
Immature Granulocytes: 0 %
Lymphocytes Relative: 16 %
Lymphs Abs: 1 K/uL (ref 0.7–4.0)
MCH: 29.2 pg (ref 26.0–34.0)
MCHC: 33.7 g/dL (ref 30.0–36.0)
MCV: 86.6 fL (ref 80.0–100.0)
Monocytes Absolute: 0.4 K/uL (ref 0.1–1.0)
Monocytes Relative: 7 %
Neutro Abs: 4.5 K/uL (ref 1.7–7.7)
Neutrophils Relative %: 71 %
Platelet Count: 218 K/uL (ref 150–400)
RBC: 4.93 MIL/uL (ref 4.22–5.81)
RDW: 14.2 % (ref 11.5–15.5)
WBC Count: 6.3 K/uL (ref 4.0–10.5)
nRBC: 0 % (ref 0.0–0.2)

## 2022-04-01 LAB — IRON AND IRON BINDING CAPACITY (CC-WL,HP ONLY)
Iron: 102 ug/dL (ref 45–182)
Saturation Ratios: 29 % (ref 17.9–39.5)
TIBC: 358 ug/dL (ref 250–450)
UIBC: 256 ug/dL (ref 117–376)

## 2022-04-01 LAB — CMP (CANCER CENTER ONLY)
ALT: 31 U/L (ref 0–44)
AST: 23 U/L (ref 15–41)
Albumin: 4.2 g/dL (ref 3.5–5.0)
Alkaline Phosphatase: 86 U/L (ref 38–126)
Anion gap: 8 (ref 5–15)
BUN: 14 mg/dL (ref 8–23)
CO2: 27 mmol/L (ref 22–32)
Calcium: 9.4 mg/dL (ref 8.9–10.3)
Chloride: 104 mmol/L (ref 98–111)
Creatinine: 0.95 mg/dL (ref 0.61–1.24)
GFR, Estimated: >60 mL/min (ref >60)
Glucose, Bld: 142 mg/dL — ABNORMAL HIGH (ref 70–99)
Potassium: 4 mmol/L (ref 3.5–5.1)
Sodium: 139 mmol/L (ref 135–145)
Total Bilirubin: 0.7 mg/dL (ref 0.3–1.2)
Total Protein: 7.9 g/dL (ref 6.5–8.1)

## 2022-04-01 LAB — FERRITIN: Ferritin: 232 ng/mL (ref 24–336)

## 2022-04-07 ENCOUNTER — Encounter: Payer: Self-pay | Admitting: Hematology

## 2022-04-07 NOTE — Progress Notes (Signed)
? ? ? ?HEMATOLOGY/ONCOLOGY CLINIC NOTE ? ?Date of Service: .04/01/2022 ? ? ?Patient Care Team: ?[name], MD as PCP - General (Internal Medicine)  ? ?Gastroenterology: Dr. Paulita Fujita ? ?CHIEF COMPLAINTS ? ?Follow-up for continued evaluation and management of iron deficiency anemia ? ? 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 Paulita Fujita showed benign polyps. He was recommended repeat colonoscopy in 3-5 years. ? ?Current Treatment ?IV Iron as needed to maintain ferritin >100 ?B complex 1 cap po daily ?B12 500 ?g daily orally ? ?HISTORY OF PRESENTING ILLNESS:  (Plz see initial consultation for details regarding initial presentation) ? ?INTERVAL HISTORY ? ?James Cooke is here for 1 year follow-up for evaluation of his iron deficiency anemia. ?He notes no abdominal symptoms.  He notes no evidence of black stools or blood in the stools. ?Labs done today were reviewed in detail with the patient. ? ?MEDICAL HISTORY:  ?Past Medical History:  ?Diagnosis Date  ? Anemia   ? Diabetes mellitus without complication ([place])   ? Hyperlipidemia   ? Hypertension   ? Iron deficiency   ? Macular degeneration   ? Meningitis   ? ? ?SURGICAL HISTORY: ?Past Surgical History:  ?Procedure Laterality Date  ? APPENDECTOMY    ? COLONOSCOPY    ? ESOPHAGOGASTRODUODENOSCOPY    ? ESOPHAGOGASTRODUODENOSCOPY (EGD) WITH PROPOFOL N/A 12/10/2015  ? Procedure: ESOPHAGOGASTRODUODENOSCOPY (EGD) WITH PROPOFOL;  Surgeon: Wilford Corner, MD;  Location: [facility] ENDOSCOPY;  Service: Gastroenterology;  Laterality: N/A;  ? GIVENS CAPSULE STUDY N/A 11/10/2015  ? Procedure: GIVENS CAPSULE STUDY;  Surgeon: Arta Silence, MD;  Location: [facility] ENDOSCOPY;  Service: Endoscopy;  Laterality: N/A;  ? HERNIA REPAIR    ? TONSILLECTOMY    ? ? ?SOCIAL HISTORY: ?Social History  ? ?Socioeconomic History  ? Marital status: Single   ?  Spouse name: Not on file  ? Number of children: Not on file  ? Years of education: Not on file  ? Highest education level: Not on file  ?Occupational History  ? Not on file  ?Tobacco Use  ? Smoking status: Never  ? Smokeless tobacco: Never  ?Vaping Use  ? Vaping Use: Never used  ?Substance and Sexual Activity  ? Alcohol use: No  ?  Alcohol/week: 0.0 standard drinks  ? Drug use: No  ? Sexual activity: Never  ?Other Topics Concern  ? Not on file  ?Social History Narrative  ? Not on file  ? ?Social Determinants of Health  ? ?Financial Resource Strain: Not on file  ?Food Insecurity: Not on file  ?Transportation Needs: Not on file  ?Physical Activity: Not on file  ?Stress: Not on file  ?Social Connections: Not on file  ?Intimate Partner Violence: Not on file  ? ? ?FAMILY HISTORY: ?Family History  ?Problem Relation Age of Onset  ? CAD Mother   ? ? ?ALLERGIES:  has No Known Allergies. ? ?MEDICATIONS:  ?Current Outpatient Medications  ?Medication Sig Dispense Refill  ? ACCU-CHEK AVIVA PLUS test strip     ? amLODipine (NORVASC) 5 MG tablet Take 5 mg by mouth every evening.     ? atorvastatin (LIPITOR) 80 MG tablet Take 80 mg by mouth daily at 6 PM.     ? B Complex-C-E-Zn (STRESS-600/ZINC PO) Take 1 tablet by mouth daily.    ? CALCIUM-MAGNESIUM-VITAMIN D ER PO Take by mouth.    ?  docusate sodium (COLACE) 100 MG capsule Take 2 capsules (200 mg total) by mouth at bedtime as needed for mild constipation or moderate constipation. 60 capsule 0  ? glimepiride (AMARYL) 2 MG tablet Take 2 mg by mouth daily with breakfast.    ? metFORMIN (GLUCOPHAGE) 500 MG tablet Take 500 mg by mouth every evening.     ? Multiple Vitamins-Minerals (ICAPS) CAPS Take 1 capsule by mouth daily.    ? omeprazole (PRILOSEC) 20 MG capsule Take 20 mg by mouth daily.     ? pioglitazone (ACTOS) 15 MG tablet     ? ?No current facility-administered medications for this visit.  ? ?Facility-Administered Medications Ordered in Other Visits  ?Medication Dose  Route Frequency Provider Last Rate Last Admin  ? 0.9 %  sodium chloride infusion   Intravenous Once [name], MD      ? acetaminophen (TYLENOL) tablet 650 mg  650 mg Oral Once [name], MD      ? diphenhydrAMINE (BENADRYL) capsule 25 mg  25 mg Oral Once [name], MD      ? ? ?REVIEW OF SYSTEMS:   ?10 Point review of Systems was done is negative except as noted above. ? ?PHYSICAL EXAMINATION: ?ECOG PERFORMANCE STATUS: 2 - Symptomatic, <50% confined to bed ?Today's Vitals  ? 04/01/22 1000  ?BP: (!) 145/74  ?Pulse: 94  ?Resp: 18  ?Temp: 97.7 ?F (36.5 ?C)  ?SpO2: 97%  ?Weight: 232 lb 3.2 oz (105.3 kg)  ?NAD ?GENERAL:alert, in no acute distress and comfortable ?SKIN: no acute rashes, no significant lesions ?EYES: conjunctiva are pink and non-injected, sclera anicteric ?OROPHARYNX: MMM, no exudates, no oropharyngeal erythema or ulceration ?NECK: supple, no JVD ?LYMPH:  no palpable lymphadenopathy in the cervical, axillary or inguinal regions ?LUNGS: clear to auscultation b/l with normal respiratory effort ?HEART: regular rate & rhythm ?ABDOMEN:  normoactive bowel sounds , non tender, not distended. ?Extremity: no pedal edema ?PSYCH: alert & oriented x 3 with fluent speech ?NEURO: no focal motor/sensory deficits ? ?LABORATORY DATA:  ?I have reviewed the data as listed ? ?. ? ?  Latest Ref Rng & Units 04/01/2022  ?  9:31 AM 04/01/2021  ?  9:37 AM 04/01/2020  ?  8:53 AM  ?CBC  ?WBC 4.0 - 10.5 K/uL 6.3   7.1   6.0    ?Hemoglobin 13.0 - 17.0 g/dL 14.4   14.0   13.9    ?Hematocrit 39.0 - 52.0 % 42.7   43.3   43.1    ?Platelets 150 - 400 K/uL 218   202   191    ? ?. ?CBC ?   ?Component Value Date/Time  ? WBC 6.3 04/01/2022 0931  ? WBC 7.1 04/01/2021 0937  ? RBC 4.93 04/01/2022 0931  ? HGB 14.4 04/01/2022 0931  ? HGB 13.3 10/27/2017 0958  ? HCT 42.7 04/01/2022 0931  ? HCT 40.7 10/27/2017 0958  ? PLT 218 04/01/2022 0931  ? PLT 230 10/27/2017 0958  ? MCV 86.6 04/01/2022 0931  ? MCV 89.6 10/27/2017 0958  ? MCH 29.2 04/01/2022  0931  ? MCHC 33.7 04/01/2022 0931  ? RDW 14.2 04/01/2022 0931  ? RDW 14.6 10/27/2017 0958  ? LYMPHSABS 1.0 04/01/2022 0931  ? LYMPHSABS 0.5 (L) 10/27/2017 0102  ? MONOABS 0.4 04/01/2022 0931  ? MONOABS 0.3 10/27/2017 0958  ? EOSABS 0.3 04/01/2022 0931  ? EOSABS 0.2 10/27/2017 0958  ? BASOSABS 0.1 04/01/2022 0931  ? BASOSABS 0.0 10/27/2017 0958  ? ?. ? ?  Latest Ref Rng & Units 04/01/2022  ?  9:31 AM 04/01/2021  ?  9:37 AM 04/01/2020  ?  8:53 AM  ?CMP  ?Glucose 70 - 99 mg/dL 142   96   113    ?BUN 8 - 23 mg/dL '14   19   15    '$ ?Creatinine 0.61 - 1.24 mg/dL 0.95   0.93   0.97    ?Sodium 135 - 145 mmol/L 139   140   140    ?Potassium 3.5 - 5.1 mmol/L 4.0   4.3   4.3    ?Chloride 98 - 111 mmol/L 104   105   105    ?CO2 22 - 32 mmol/L '27   26   23    '$ ?Calcium 8.9 - 10.3 mg/dL 9.4   9.4   9.2    ?Total Protein 6.5 - 8.1 g/dL 7.9   7.3   7.6    ?Total Bilirubin 0.3 - 1.2 mg/dL 0.7   0.7   0.8    ?Alkaline Phos 38 - 126 U/L 86   85   100    ?AST 15 - 41 U/L '23   23   24    '$ ?ALT 0 - 44 U/L '31   26   28    '$ ? ?. ?Lab Results  ?Component Value Date  ? IRON 102 04/01/2022  ? TIBC 358 04/01/2022  ? IRONPCTSAT 29 04/01/2022  ? ?(Iron and TIBC) ? ?Lab Results  ?Component Value Date  ? FERRITIN 232 04/01/2022  ? ? ? ?RADIOGRAPHIC STUDIES: ?I have personally reviewed the radiological images as listed and agreed with the findings in the report. ?No results found. ? ?RADIOGRAPHIC STUDIES: ?I have personally reviewed the radiological images as listed and agreed with the findings in the report. ?No results found. ? ?ASSESSMENT & PLAN:  ? ?81 y.o. male with  ? ?#1 Microcytic hypochromic anemia. Iron deficiency anemia recurrent and likely due to ongoing GI blood losses.  ? ? The source of GI bleeding thought to be his Ulcers related to his hiatal hernia . ?Patient has refused surgical option to address his hiatal hernia with ulceration causing recurrent chronic GI bleeds. ?Platelet function, PT and PTT were within normal limits and suggest  against the presence of a hemostatic defect. ? ?PLAN: ?-Patient's labs from today were discussed in detail.  His CBC shows normal hemoglobin of 14.4 with normal WBC count and platelets. ?No evidence of overt G

## 2022-07-14 DIAGNOSIS — I10 Essential (primary) hypertension: Secondary | ICD-10-CM | POA: Diagnosis not present

## 2022-07-14 DIAGNOSIS — E119 Type 2 diabetes mellitus without complications: Secondary | ICD-10-CM | POA: Diagnosis not present

## 2022-07-14 DIAGNOSIS — D509 Iron deficiency anemia, unspecified: Secondary | ICD-10-CM | POA: Diagnosis not present

## 2022-07-21 DIAGNOSIS — I10 Essential (primary) hypertension: Secondary | ICD-10-CM | POA: Diagnosis not present

## 2022-07-21 DIAGNOSIS — E7800 Pure hypercholesterolemia, unspecified: Secondary | ICD-10-CM | POA: Diagnosis not present

## 2022-07-21 DIAGNOSIS — M5136 Other intervertebral disc degeneration, lumbar region: Secondary | ICD-10-CM | POA: Diagnosis not present

## 2022-07-21 DIAGNOSIS — R062 Wheezing: Secondary | ICD-10-CM | POA: Diagnosis not present

## 2022-07-21 DIAGNOSIS — H3530 Unspecified macular degeneration: Secondary | ICD-10-CM | POA: Diagnosis not present

## 2022-07-21 DIAGNOSIS — D509 Iron deficiency anemia, unspecified: Secondary | ICD-10-CM | POA: Diagnosis not present

## 2022-07-21 DIAGNOSIS — E119 Type 2 diabetes mellitus without complications: Secondary | ICD-10-CM | POA: Diagnosis not present

## 2022-07-21 DIAGNOSIS — R946 Abnormal results of thyroid function studies: Secondary | ICD-10-CM | POA: Diagnosis not present

## 2022-08-03 DIAGNOSIS — H524 Presbyopia: Secondary | ICD-10-CM | POA: Diagnosis not present

## 2022-08-03 DIAGNOSIS — E119 Type 2 diabetes mellitus without complications: Secondary | ICD-10-CM | POA: Diagnosis not present

## 2022-08-03 DIAGNOSIS — H353132 Nonexudative age-related macular degeneration, bilateral, intermediate dry stage: Secondary | ICD-10-CM | POA: Diagnosis not present

## 2022-08-03 DIAGNOSIS — H40013 Open angle with borderline findings, low risk, bilateral: Secondary | ICD-10-CM | POA: Diagnosis not present

## 2023-01-24 DIAGNOSIS — E119 Type 2 diabetes mellitus without complications: Secondary | ICD-10-CM | POA: Diagnosis not present

## 2023-01-24 DIAGNOSIS — R946 Abnormal results of thyroid function studies: Secondary | ICD-10-CM | POA: Diagnosis not present

## 2023-01-24 DIAGNOSIS — D509 Iron deficiency anemia, unspecified: Secondary | ICD-10-CM | POA: Diagnosis not present

## 2023-01-24 DIAGNOSIS — R062 Wheezing: Secondary | ICD-10-CM | POA: Diagnosis not present

## 2023-01-24 DIAGNOSIS — I10 Essential (primary) hypertension: Secondary | ICD-10-CM | POA: Diagnosis not present

## 2023-01-24 DIAGNOSIS — E7800 Pure hypercholesterolemia, unspecified: Secondary | ICD-10-CM | POA: Diagnosis not present

## 2023-01-31 DIAGNOSIS — Z9109 Other allergy status, other than to drugs and biological substances: Secondary | ICD-10-CM | POA: Diagnosis not present

## 2023-01-31 DIAGNOSIS — E118 Type 2 diabetes mellitus with unspecified complications: Secondary | ICD-10-CM | POA: Diagnosis not present

## 2023-01-31 DIAGNOSIS — Z0000 Encounter for general adult medical examination without abnormal findings: Secondary | ICD-10-CM | POA: Diagnosis not present

## 2023-01-31 DIAGNOSIS — D509 Iron deficiency anemia, unspecified: Secondary | ICD-10-CM | POA: Diagnosis not present

## 2023-01-31 DIAGNOSIS — M5136 Other intervertebral disc degeneration, lumbar region: Secondary | ICD-10-CM | POA: Diagnosis not present

## 2023-01-31 DIAGNOSIS — H3530 Unspecified macular degeneration: Secondary | ICD-10-CM | POA: Diagnosis not present

## 2023-01-31 DIAGNOSIS — I10 Essential (primary) hypertension: Secondary | ICD-10-CM | POA: Diagnosis not present

## 2023-01-31 DIAGNOSIS — E7800 Pure hypercholesterolemia, unspecified: Secondary | ICD-10-CM | POA: Diagnosis not present

## 2023-01-31 DIAGNOSIS — R748 Abnormal levels of other serum enzymes: Secondary | ICD-10-CM | POA: Diagnosis not present

## 2023-01-31 DIAGNOSIS — R062 Wheezing: Secondary | ICD-10-CM | POA: Diagnosis not present

## 2023-02-22 DIAGNOSIS — H40013 Open angle with borderline findings, low risk, bilateral: Secondary | ICD-10-CM | POA: Diagnosis not present

## 2023-08-03 DIAGNOSIS — D649 Anemia, unspecified: Secondary | ICD-10-CM | POA: Diagnosis not present

## 2023-08-03 DIAGNOSIS — R946 Abnormal results of thyroid function studies: Secondary | ICD-10-CM | POA: Diagnosis not present

## 2023-08-03 DIAGNOSIS — E118 Type 2 diabetes mellitus with unspecified complications: Secondary | ICD-10-CM | POA: Diagnosis not present

## 2023-08-03 DIAGNOSIS — I10 Essential (primary) hypertension: Secondary | ICD-10-CM | POA: Diagnosis not present

## 2023-08-10 DIAGNOSIS — I10 Essential (primary) hypertension: Secondary | ICD-10-CM | POA: Diagnosis not present

## 2023-08-10 DIAGNOSIS — M5136 Other intervertebral disc degeneration, lumbar region: Secondary | ICD-10-CM | POA: Diagnosis not present

## 2023-08-10 DIAGNOSIS — H3530 Unspecified macular degeneration: Secondary | ICD-10-CM | POA: Diagnosis not present

## 2023-08-10 DIAGNOSIS — Z23 Encounter for immunization: Secondary | ICD-10-CM | POA: Diagnosis not present

## 2023-08-10 DIAGNOSIS — D509 Iron deficiency anemia, unspecified: Secondary | ICD-10-CM | POA: Diagnosis not present

## 2023-08-10 DIAGNOSIS — E7800 Pure hypercholesterolemia, unspecified: Secondary | ICD-10-CM | POA: Diagnosis not present

## 2023-08-10 DIAGNOSIS — E118 Type 2 diabetes mellitus with unspecified complications: Secondary | ICD-10-CM | POA: Diagnosis not present

## 2023-08-10 DIAGNOSIS — D649 Anemia, unspecified: Secondary | ICD-10-CM | POA: Diagnosis not present

## 2023-08-25 DIAGNOSIS — H353132 Nonexudative age-related macular degeneration, bilateral, intermediate dry stage: Secondary | ICD-10-CM | POA: Diagnosis not present

## 2023-08-25 DIAGNOSIS — H40013 Open angle with borderline findings, low risk, bilateral: Secondary | ICD-10-CM | POA: Diagnosis not present

## 2023-08-25 DIAGNOSIS — H35033 Hypertensive retinopathy, bilateral: Secondary | ICD-10-CM | POA: Diagnosis not present

## 2023-08-25 DIAGNOSIS — E119 Type 2 diabetes mellitus without complications: Secondary | ICD-10-CM | POA: Diagnosis not present

## 2023-08-25 DIAGNOSIS — H524 Presbyopia: Secondary | ICD-10-CM | POA: Diagnosis not present

## 2024-01-25 DIAGNOSIS — E118 Type 2 diabetes mellitus with unspecified complications: Secondary | ICD-10-CM | POA: Diagnosis not present

## 2024-01-25 DIAGNOSIS — I10 Essential (primary) hypertension: Secondary | ICD-10-CM | POA: Diagnosis not present

## 2024-01-25 DIAGNOSIS — E7800 Pure hypercholesterolemia, unspecified: Secondary | ICD-10-CM | POA: Diagnosis not present

## 2024-01-25 DIAGNOSIS — D509 Iron deficiency anemia, unspecified: Secondary | ICD-10-CM | POA: Diagnosis not present

## 2024-02-01 DIAGNOSIS — E7800 Pure hypercholesterolemia, unspecified: Secondary | ICD-10-CM | POA: Diagnosis not present

## 2024-02-01 DIAGNOSIS — Z0000 Encounter for general adult medical examination without abnormal findings: Secondary | ICD-10-CM | POA: Diagnosis not present

## 2024-02-01 DIAGNOSIS — H3530 Unspecified macular degeneration: Secondary | ICD-10-CM | POA: Diagnosis not present

## 2024-02-01 DIAGNOSIS — Z23 Encounter for immunization: Secondary | ICD-10-CM | POA: Diagnosis not present

## 2024-02-01 DIAGNOSIS — D509 Iron deficiency anemia, unspecified: Secondary | ICD-10-CM | POA: Diagnosis not present

## 2024-02-01 DIAGNOSIS — I10 Essential (primary) hypertension: Secondary | ICD-10-CM | POA: Diagnosis not present

## 2024-03-05 DIAGNOSIS — H40013 Open angle with borderline findings, low risk, bilateral: Secondary | ICD-10-CM | POA: Diagnosis not present

## 2024-08-02 DIAGNOSIS — E7800 Pure hypercholesterolemia, unspecified: Secondary | ICD-10-CM | POA: Diagnosis not present

## 2024-08-02 DIAGNOSIS — D509 Iron deficiency anemia, unspecified: Secondary | ICD-10-CM | POA: Diagnosis not present

## 2024-08-02 DIAGNOSIS — I10 Essential (primary) hypertension: Secondary | ICD-10-CM | POA: Diagnosis not present

## 2024-08-08 DIAGNOSIS — E7800 Pure hypercholesterolemia, unspecified: Secondary | ICD-10-CM | POA: Diagnosis not present

## 2024-08-08 DIAGNOSIS — E118 Type 2 diabetes mellitus with unspecified complications: Secondary | ICD-10-CM | POA: Diagnosis not present

## 2024-08-08 DIAGNOSIS — D509 Iron deficiency anemia, unspecified: Secondary | ICD-10-CM | POA: Diagnosis not present

## 2024-08-08 DIAGNOSIS — I10 Essential (primary) hypertension: Secondary | ICD-10-CM | POA: Diagnosis not present

## 2024-08-08 DIAGNOSIS — Z23 Encounter for immunization: Secondary | ICD-10-CM | POA: Diagnosis not present

## 2024-08-08 DIAGNOSIS — H3530 Unspecified macular degeneration: Secondary | ICD-10-CM | POA: Diagnosis not present

## 2024-08-08 DIAGNOSIS — E1340 Other specified diabetes mellitus with diabetic neuropathy, unspecified: Secondary | ICD-10-CM | POA: Diagnosis not present
